# Patient Record
Sex: Female | Born: 1945 | Race: Black or African American | Hispanic: No | Smoking: Never smoker
Health system: Southern US, Community
[De-identification: ages and names within clinical notes are randomized; demographics above are authoritative.]

## PROBLEM LIST (undated history)

## (undated) DIAGNOSIS — M1990 Unspecified osteoarthritis, unspecified site: Secondary | ICD-10-CM

## (undated) DIAGNOSIS — E119 Type 2 diabetes mellitus without complications: Secondary | ICD-10-CM

## (undated) DIAGNOSIS — I10 Essential (primary) hypertension: Secondary | ICD-10-CM

## (undated) DIAGNOSIS — T7840XA Allergy, unspecified, initial encounter: Secondary | ICD-10-CM

## (undated) DIAGNOSIS — K219 Gastro-esophageal reflux disease without esophagitis: Secondary | ICD-10-CM

## (undated) DIAGNOSIS — R42 Dizziness and giddiness: Secondary | ICD-10-CM

## (undated) HISTORY — PX: ABDOMINAL HYSTERECTOMY: SHX81

## (undated) HISTORY — PX: COLONOSCOPY: SHX174

## (undated) HISTORY — PX: ROTATOR CUFF REPAIR: SHX139

## (undated) HISTORY — PX: CHOLECYSTECTOMY: SHX55

---

## 2010-06-15 ENCOUNTER — Emergency Department (HOSPITAL_BASED_OUTPATIENT_CLINIC_OR_DEPARTMENT_OTHER)
Admission: EM | Admit: 2010-06-15 | Discharge: 2010-06-15 | Disposition: A | Discharge status: Home / Self Care | Payer: No Typology Code available for payment source | Attending: Emergency Medicine | Admitting: Emergency Medicine

## 2010-06-15 DIAGNOSIS — Z043 Encounter for examination and observation following other accident: Secondary | ICD-10-CM | POA: Insufficient documentation

## 2014-10-05 ENCOUNTER — Other Ambulatory Visit: Payer: Self-pay | Admitting: Orthopaedic Surgery

## 2014-10-05 DIAGNOSIS — M25511 Pain in right shoulder: Secondary | ICD-10-CM

## 2014-10-06 ENCOUNTER — Ambulatory Visit
Admission: RE | Admit: 2014-10-06 | Discharge: 2014-10-06 | Disposition: A | Discharge status: Home / Self Care | Payer: Medicare Other | Source: Ambulatory Visit | Attending: Orthopaedic Surgery | Admitting: Orthopaedic Surgery

## 2014-10-06 DIAGNOSIS — M25511 Pain in right shoulder: Secondary | ICD-10-CM

## 2015-10-01 ENCOUNTER — Other Ambulatory Visit (HOSPITAL_BASED_OUTPATIENT_CLINIC_OR_DEPARTMENT_OTHER): Payer: Self-pay | Admitting: Orthopaedic Surgery

## 2015-10-01 DIAGNOSIS — M545 Low back pain: Secondary | ICD-10-CM

## 2015-10-01 DIAGNOSIS — M48061 Spinal stenosis, lumbar region without neurogenic claudication: Secondary | ICD-10-CM

## 2015-10-05 ENCOUNTER — Ambulatory Visit (HOSPITAL_BASED_OUTPATIENT_CLINIC_OR_DEPARTMENT_OTHER)
Admission: RE | Admit: 2015-10-05 | Discharge: 2015-10-05 | Disposition: A | Discharge status: Home / Self Care | Payer: Medicare Other | Source: Ambulatory Visit | Attending: Orthopaedic Surgery | Admitting: Orthopaedic Surgery

## 2015-10-05 DIAGNOSIS — M4807 Spinal stenosis, lumbosacral region: Secondary | ICD-10-CM | POA: Insufficient documentation

## 2015-10-05 DIAGNOSIS — M7138 Other bursal cyst, other site: Secondary | ICD-10-CM | POA: Insufficient documentation

## 2015-10-05 DIAGNOSIS — M545 Low back pain: Secondary | ICD-10-CM

## 2015-10-05 DIAGNOSIS — M4806 Spinal stenosis, lumbar region: Secondary | ICD-10-CM | POA: Diagnosis not present

## 2015-10-05 DIAGNOSIS — G541 Lumbosacral plexus disorders: Secondary | ICD-10-CM | POA: Insufficient documentation

## 2015-10-05 DIAGNOSIS — M48061 Spinal stenosis, lumbar region without neurogenic claudication: Secondary | ICD-10-CM

## 2015-10-05 DIAGNOSIS — M5136 Other intervertebral disc degeneration, lumbar region: Secondary | ICD-10-CM | POA: Diagnosis not present

## 2016-03-02 ENCOUNTER — Other Ambulatory Visit (INDEPENDENT_AMBULATORY_CARE_PROVIDER_SITE_OTHER): Payer: Self-pay

## 2016-03-02 MED ORDER — HYDROCODONE-ACETAMINOPHEN 5-325 MG PO TABS: ORAL_TABLET | ORAL | 0 refills | Status: DC

## 2016-03-19 ENCOUNTER — Encounter (INDEPENDENT_AMBULATORY_CARE_PROVIDER_SITE_OTHER): Payer: Self-pay | Admitting: Orthopaedic Surgery

## 2016-03-19 ENCOUNTER — Ambulatory Visit (INDEPENDENT_AMBULATORY_CARE_PROVIDER_SITE_OTHER): Payer: Medicare Other | Admitting: Orthopaedic Surgery

## 2016-03-19 ENCOUNTER — Ambulatory Visit (INDEPENDENT_AMBULATORY_CARE_PROVIDER_SITE_OTHER): Payer: Medicare Other

## 2016-03-19 DIAGNOSIS — M25512 Pain in left shoulder: Secondary | ICD-10-CM | POA: Diagnosis not present

## 2016-03-19 DIAGNOSIS — G8929 Other chronic pain: Secondary | ICD-10-CM

## 2016-03-19 MED ORDER — LIDOCAINE HCL 1 % IJ SOLN
3.0000 mL | INTRAMUSCULAR | Status: AC | PRN
  Administered 2016-03-19: 3 mL

## 2016-03-19 MED ORDER — BUPIVACAINE HCL 0.5 % IJ SOLN
3.0000 mL | INTRAMUSCULAR | Status: AC | PRN
  Administered 2016-03-19: 3 mL via INTRA_ARTICULAR

## 2016-03-19 MED ORDER — METHYLPREDNISOLONE ACETATE 40 MG/ML IJ SUSP
40.0000 mg | INTRAMUSCULAR | Status: AC | PRN
  Administered 2016-03-19: 40 mg via INTRA_ARTICULAR

## 2016-03-19 NOTE — Progress Notes (Signed)
   Office Visit Note   Patient: Tonya Chaney           Date of Birth: 07/10/1945           MRN: [id number] Visit Date: 03/19/2016              Requested by: Agustina CaroliKristin D Hicks, MD No address on file PCP: Agustina CaroliHICKS, KRISTIN D, MD   Assessment & Plan: Visit Diagnoses:  1. Chronic left shoulder pain     Plan: Impression is left rotator cuff syndrome with preservation of rotator cuff function. Subacromial injection provided today. Home exercises. Follow-up as needed.  Follow-Up Instructions: Return if symptoms worsen or fail to improve.   Orders:  Orders Placed This Encounter  Procedures  . Large Joint Injection/Arthrocentesis  . XR Shoulder Left   No orders of the defined types were placed in this encounter.     Procedures: Large Joint Inj Date/Time: 03/19/2016 8:04 PM Performed by: Tarry KosXU, NAIPING M Authorized by: Tarry KosXU, NAIPING M   Consent Given by:  Patient Timeout: prior to procedure the correct patient, procedure, and site was verified   Location:  Shoulder Site:  L subacromial bursa Prep: patient was prepped and draped in usual sterile fashion   Needle Size:  22 G Approach:  Posterior Ultrasound Guidance: No   Fluoroscopic Guidance: No   Arthrogram: No   Medications:  3 mL lidocaine 1 %; 3 mL bupivacaine 0.5 %; 40 mg methylPREDNISolone acetate 40 MG/ML     Clinical Data: No additional findings.   Subjective: Chief Complaint  Patient presents with  . Left Shoulder - Pain    Patient is a 71 year old female comes in with new problem left shoulder pain for about 6 months. Pain is worse with picking up things and sometimes will radiate up the neck and down the arm but not past the elbow. She has some difficulty sleeping. She's been taking Norco and Biofreeze.    Review of Systems Complete review of systems is negative except for history of present illness  Objective: Vital Signs: There were no vitals taken for this visit.  Physical Exam Well-developed  well-nourished no acute distress alert and oriented 3 Ortho Exam Exam of the left shoulder shows positive drop arm test. She endorses no nagging aching pain in the left shoulder. Positive empty can testing. Overall she has good function. Specialty Comments:  No specialty comments available.  Imaging: Xr Shoulder Left  Result Date: 03/19/2016 No acute findings    PMFS History: There are no active problems to display for this patient.  No past medical history on file.  No family history on file.  No past surgical history on file. Social History   Occupational History  . Not on file.   Social History Main Topics  . Smoking status: Never Smoker  . Smokeless tobacco: Never Used  . Alcohol use Not on file  . Drug use: Unknown  . Sexual activity: Not on file

## 2016-04-15 ENCOUNTER — Emergency Department (HOSPITAL_BASED_OUTPATIENT_CLINIC_OR_DEPARTMENT_OTHER)
Admission: EM | Admit: 2016-04-15 | Discharge: 2016-04-16 | Payer: Medicare Other | Attending: Emergency Medicine | Admitting: Emergency Medicine

## 2016-04-15 ENCOUNTER — Encounter (HOSPITAL_BASED_OUTPATIENT_CLINIC_OR_DEPARTMENT_OTHER): Payer: Self-pay | Admitting: *Deleted

## 2016-04-15 ENCOUNTER — Emergency Department (HOSPITAL_BASED_OUTPATIENT_CLINIC_OR_DEPARTMENT_OTHER): Payer: Medicare Other

## 2016-04-15 DIAGNOSIS — R079 Chest pain, unspecified: Secondary | ICD-10-CM | POA: Diagnosis not present

## 2016-04-15 DIAGNOSIS — Z794 Long term (current) use of insulin: Secondary | ICD-10-CM | POA: Insufficient documentation

## 2016-04-15 DIAGNOSIS — Z7982 Long term (current) use of aspirin: Secondary | ICD-10-CM | POA: Insufficient documentation

## 2016-04-15 DIAGNOSIS — Z79899 Other long term (current) drug therapy: Secondary | ICD-10-CM | POA: Diagnosis not present

## 2016-04-15 DIAGNOSIS — E119 Type 2 diabetes mellitus without complications: Secondary | ICD-10-CM | POA: Diagnosis not present

## 2016-04-15 DIAGNOSIS — I10 Essential (primary) hypertension: Secondary | ICD-10-CM | POA: Insufficient documentation

## 2016-04-15 DIAGNOSIS — R0600 Dyspnea, unspecified: Secondary | ICD-10-CM | POA: Diagnosis not present

## 2016-04-15 DIAGNOSIS — R0609 Other forms of dyspnea: Secondary | ICD-10-CM

## 2016-04-15 DIAGNOSIS — R609 Edema, unspecified: Secondary | ICD-10-CM

## 2016-04-15 HISTORY — DX: Essential (primary) hypertension: I10

## 2016-04-15 HISTORY — DX: Type 2 diabetes mellitus without complications: E11.9

## 2016-04-15 LAB — CBC WITH DIFFERENTIAL/PLATELET
Basophils Absolute: 0 10*3/uL (ref 0.0–0.1)
Basophils Relative: 0 %
Eosinophils Absolute: 0.2 10*3/uL (ref 0.0–0.7)
Eosinophils Relative: 2 %
HCT: 34.3 % — ABNORMAL LOW (ref 36.0–46.0)
Hemoglobin: 10.7 g/dL — ABNORMAL LOW (ref 12.0–15.0)
Lymphocytes Relative: 48 %
Lymphs Abs: 3.5 10*3/uL (ref 0.7–4.0)
MCH: 26.7 pg (ref 26.0–34.0)
MCHC: 31.2 g/dL (ref 30.0–36.0)
MCV: 85.5 fL (ref 78.0–100.0)
Monocytes Absolute: 0.5 10*3/uL (ref 0.1–1.0)
Monocytes Relative: 7 %
Neutro Abs: 3.2 10*3/uL (ref 1.7–7.7)
Neutrophils Relative %: 43 %
Platelets: 328 10*3/uL (ref 150–400)
RBC: 4.01 MIL/uL (ref 3.87–5.11)
RDW: 13.6 % (ref 11.5–15.5)
WBC: 7.4 10*3/uL (ref 4.0–10.5)

## 2016-04-15 LAB — BASIC METABOLIC PANEL
Anion gap: 6 (ref 5–15)
BUN: 8 mg/dL (ref 6–20)
CO2: 28 mmol/L (ref 22–32)
Calcium: 8.8 mg/dL — ABNORMAL LOW (ref 8.9–10.3)
Chloride: 101 mmol/L (ref 101–111)
Creatinine, Ser: 0.69 mg/dL (ref 0.44–1.00)
GFR calc Af Amer: >60 mL/min (ref >60)
GFR calc non Af Amer: >60 mL/min (ref >60)
Glucose, Bld: 208 mg/dL — ABNORMAL HIGH (ref 65–99)
Potassium: 3.5 mmol/L (ref 3.5–5.1)
Sodium: 135 mmol/L (ref 135–145)

## 2016-04-15 LAB — BRAIN NATRIURETIC PEPTIDE: B Natriuretic Peptide: 13.1 pg/mL (ref 0.0–100.0)

## 2016-04-15 LAB — TROPONIN I: Troponin I: <0.03 ng/mL (ref <0.03)

## 2016-04-15 MED ORDER — ETOMIDATE 2 MG/ML IV SOLN: 12.0000 mg | Freq: Once | INTRAVENOUS | Status: DC

## 2016-04-15 NOTE — ED Notes (Signed)
Pt returned from xray

## 2016-04-15 NOTE — ED Provider Notes (Signed)
[facility] Note   CSN: [id number] Arrival date & time: 04/15/16  1947  By signing my name below, I, Talbert Nan, attest that this documentation has been prepared under the direction and in the presence of Laurence Spates, MD. Electronically Signed: Talbert Nan, Scribe. 04/15/16. 9:02 PM.   History   Chief Complaint Chief Complaint  Patient presents with  . Chest Pain    HPI Tonya Chaney is a 71 y.o. female with h/o Dm, HTN who presents to the Emergency Department complaining of gradual onset, waxing and waning, intermittent right chest pain under right arm that has been going on randomly for 7 days. Pt has  bilateral leg swelling, progressive SOB that is worse when laying flat and exacerbated by exertion. Pt reports that she has noticed weight gain recently. Pt was on lasix previously but has been out of lasix for 2 months.   FH notable for brother and sister who each died of MI in their 78s.   The history is provided by the patient. No language interpreter was used.    Past Medical History:  Diagnosis Date  . Diabetes mellitus without complication (HCC)   . Hypertension     There are no active problems to display for this patient.   Past Surgical History:  Procedure Laterality Date  . ABDOMINAL HYSTERECTOMY    . CHOLECYSTECTOMY      OB History    No data available       Home Medications    Prior to Admission medications   Medication Sig Start Date End Date Taking? Authorizing Provider  aspirin 81 MG chewable tablet Chew by mouth daily.   Yes Historical Provider, MD  hydrochlorothiazide (HYDRODIURIL) 25 MG tablet Take 25 mg by mouth daily.   Yes Historical Provider, MD  insulin lispro (HUMALOG) 100 UNIT/ML injection Inject into the skin 3 (three) times daily before meals.   Yes Historical Provider, MD  losartan (COZAAR) 100 MG tablet Take 100 mg by mouth daily.   Yes Historical Provider, MD  metFORMIN (GLUCOPHAGE) 500 MG tablet Take  by mouth 2 (two) times daily with a meal.   Yes Historical Provider, MD  omeprazole (PRILOSEC) 40 MG capsule Take 40 mg by mouth daily.   Yes Historical Provider, MD  tiZANidine (ZANAFLEX) 2 MG tablet Take by mouth every 6 (six) hours as needed for muscle spasms.   Yes Historical Provider, MD  HYDROcodone-acetaminophen (NORCO/VICODIN) 5-325 MG tablet TAKE 1 TAB PO DAILY PRN PAIN 03/02/16   Tarry Kos, MD    Family History History reviewed. No pertinent family history.  Social History Social History  Substance Use Topics  . Smoking status: Never Smoker  . Smokeless tobacco: Never Used  . Alcohol use No     Allergies   Penicillins   Review of Systems Review of Systems  Cardiovascular: Positive for chest pain.   A complete 10 system review of systems was obtained and all systems are negative except as noted in the HPI and PMH.   Physical Exam Updated Vital Signs BP 125/61   Pulse 93   Temp 98.2 F (36.8 C) (Oral)   Resp 20   Ht 5\' 5"  (1.651 m)   Wt 253 lb (114.8 kg)   SpO2 100%   BMI 42.10 kg/m   Physical Exam  Constitutional: She is oriented to person, place, and time. She appears well-developed and well-nourished. No distress.  HENT:  Head: Normocephalic and atraumatic.  Moist mucous membranes  Eyes:  Conjunctivae are normal. Pupils are equal, round, and reactive to light.  Neck: Neck supple.  Cardiovascular: Normal rate, regular rhythm and normal heart sounds.   No murmur heard. Pulmonary/Chest: Effort normal and breath sounds normal.  Abdominal: Soft. Bowel sounds are normal. She exhibits no distension. There is no tenderness.  Musculoskeletal: She exhibits edema.  3+ pitting edema BLE.  Neurological: She is alert and oriented to person, place, and time.  Fluent speech  Skin: Skin is warm and dry.  Psychiatric: She has a normal mood and affect. Judgment normal.  Nursing note and vitals reviewed.    ED Treatments / Results   DIAGNOSTIC STUDIES: Oxygen  Saturation is 99% on room air, normal by my interpretation.    COORDINATION OF CARE: 9:02 PM Discussed treatment plan with pt at bedside and pt agreed to plan.  Labs (all labs ordered are listed, but only abnormal results are displayed) Labs Reviewed  CBC WITH DIFFERENTIAL/PLATELET - Abnormal; Notable for the following:       Result Value   Hemoglobin 10.7 (*)    HCT 34.3 (*)    All other components within normal limits  BASIC METABOLIC PANEL - Abnormal; Notable for the following:    Glucose, Bld 208 (*)    Calcium 8.8 (*)    All other components within normal limits  D-DIMER, QUANTITATIVE (NOT AT [facility]) - Abnormal; Notable for the following:    D-Dimer, Quant 0.77 (*)    All other components within normal limits  TROPONIN I  BRAIN NATRIURETIC PEPTIDE  TROPONIN I    EKG  EKG Interpretation  Date/Time:  Wednesday April 15 2016 20:12:51 EST Ventricular Rate:  81 PR Interval:  148 QRS Duration: 72 QT Interval:  382 QTC Calculation: 443 R Axis:   50 Text Interpretation:  Normal sinus rhythm Cannot rule out Anterior infarct , age undetermined Abnormal ECG No previous ECGs available Confirmed by Keyondra Lagrand MD, Shanaiya Bene [phone]) on 04/15/2016 8:24:12 PM       Radiology Dg Chest 2 View  Result Date: 04/15/2016 CLINICAL DATA:  71 y/o F; shortness of breath wall lying down. Chest pain. EXAM: CHEST  2 VIEW COMPARISON:  05/19/2007 chest radiograph. FINDINGS: Borderline cardiomegaly. Clear lungs. No pleural effusion. Moderate degenerative changes of thoracic spine. Right upper quadrant cholecystectomy clips. IMPRESSION: Borderline cardiomegaly.  Clear lungs. Electronically Signed   By: Mitzi Hansen M.D.   On: 04/15/2016 21:10    Procedures Procedures (including critical care time)  Medications Ordered in ED Medications - No data to display   Initial Impression / Assessment and Plan / ED Course  I have reviewed the triage vital signs and the nursing notes.  Pertinent  labs & imaging results that were available during my care of the patient were reviewed by me and considered in my medical decision making (see chart for details).     Pt w/ HTN and T2DM p/w worsening dyspnea particularly with exertion and laying flat associated with leg swelling. She also reports right chest pain that is not necessarily associated with these symptoms. She was comfortable on exam with reassuring vital signs, O2 saturation 100% on room air. She had pitting edema of lower extremities but no crackles or respiratory distress. EKG without acute ischemic changes. Obtained above lab work including troponin and BNP. Troponin negative and BNP was normal. Chest x-ray shows borderline cardiomegaly with no pulmonary edema or other acute findings. Lab work overall unremarkable. Because her chest x-ray and labs do not suggest obvious CHF as  an explanation of her symptoms, I added a d-dimer which was positive. I have ordered a CTA of chest to rule out PE. Given the progressive nature of her symptoms and her dyspnea on exertion, I'm concerned about the possibility of anginal symptoms. Given her comorbidities, age, and family history, her HEART score is 4-5 and I have recommended admission for cardiac work up. Signing out to the oncoming provider. Patient will be transferred for admission after completion of CT scan.  Final Clinical Impressions(s) / ED Diagnoses   Final diagnoses:  None    New Prescriptions New Prescriptions   No medications on file   I personally performed the services described in this documentation, which was scribed in my presence. The recorded information has been reviewed and is accurate.    Laurence Spatesachel Morgan Carisma Troupe, MD 04/16/16 [phone]

## 2016-04-15 NOTE — ED Notes (Signed)
Patient transported to X-ray 

## 2016-04-15 NOTE — ED Triage Notes (Addendum)
Pt c/o extr swelling, SOB while lying down  and chest pain x 1 week

## 2016-04-16 ENCOUNTER — Emergency Department (HOSPITAL_BASED_OUTPATIENT_CLINIC_OR_DEPARTMENT_OTHER): Payer: Medicare Other

## 2016-04-16 DIAGNOSIS — R079 Chest pain, unspecified: Secondary | ICD-10-CM | POA: Diagnosis present

## 2016-04-16 DIAGNOSIS — E119 Type 2 diabetes mellitus without complications: Secondary | ICD-10-CM | POA: Diagnosis not present

## 2016-04-16 DIAGNOSIS — Z79899 Other long term (current) drug therapy: Secondary | ICD-10-CM | POA: Diagnosis not present

## 2016-04-16 DIAGNOSIS — Z7982 Long term (current) use of aspirin: Secondary | ICD-10-CM | POA: Diagnosis not present

## 2016-04-16 DIAGNOSIS — R0600 Dyspnea, unspecified: Secondary | ICD-10-CM | POA: Diagnosis not present

## 2016-04-16 DIAGNOSIS — Z794 Long term (current) use of insulin: Secondary | ICD-10-CM | POA: Diagnosis not present

## 2016-04-16 DIAGNOSIS — R609 Edema, unspecified: Secondary | ICD-10-CM | POA: Diagnosis not present

## 2016-04-16 DIAGNOSIS — I10 Essential (primary) hypertension: Secondary | ICD-10-CM | POA: Diagnosis not present

## 2016-04-16 LAB — TROPONIN I: Troponin I: <0.03 ng/mL (ref <0.03)

## 2016-04-16 LAB — D-DIMER, QUANTITATIVE: D-Dimer, Quant: 0.77 ug/mL-FEU — ABNORMAL HIGH (ref 0.00–0.50)

## 2016-04-16 MED ORDER — IOPAMIDOL (ISOVUE-370) INJECTION 76%
100.0000 mL | Freq: Once | INTRAVENOUS | Status: AC | PRN
  Administered 2016-04-16: 100 mL via INTRAVENOUS

## 2016-04-16 NOTE — ED Notes (Signed)
Pt got up to run to the bathroom, was mildly SOB upon return to room, O2 saturations remained 100%

## 2016-07-27 ENCOUNTER — Telehealth (INDEPENDENT_AMBULATORY_CARE_PROVIDER_SITE_OTHER): Payer: Self-pay

## 2016-07-27 NOTE — Telephone Encounter (Signed)
Patient request out of work note to return on 07/29/16, due to severe shoulder pain.  Please Advise

## 2016-07-27 NOTE — Telephone Encounter (Signed)
Note made.  

## 2016-07-27 NOTE — Telephone Encounter (Signed)
See message.

## 2016-07-27 NOTE — Telephone Encounter (Signed)
yes

## 2016-10-13 ENCOUNTER — Other Ambulatory Visit (INDEPENDENT_AMBULATORY_CARE_PROVIDER_SITE_OTHER): Payer: Self-pay | Admitting: Orthopaedic Surgery

## 2016-10-13 ENCOUNTER — Ambulatory Visit (INDEPENDENT_AMBULATORY_CARE_PROVIDER_SITE_OTHER): Payer: Medicare Other | Admitting: Orthopaedic Surgery

## 2016-10-13 ENCOUNTER — Encounter (INDEPENDENT_AMBULATORY_CARE_PROVIDER_SITE_OTHER): Payer: Self-pay | Admitting: Orthopaedic Surgery

## 2016-10-13 ENCOUNTER — Ambulatory Visit (INDEPENDENT_AMBULATORY_CARE_PROVIDER_SITE_OTHER): Payer: Medicare Other

## 2016-10-13 DIAGNOSIS — G8929 Other chronic pain: Secondary | ICD-10-CM | POA: Diagnosis not present

## 2016-10-13 DIAGNOSIS — M545 Low back pain: Secondary | ICD-10-CM

## 2016-10-13 DIAGNOSIS — M25511 Pain in right shoulder: Secondary | ICD-10-CM

## 2016-10-13 MED ORDER — DICLOFENAC SODIUM 75 MG PO TBEC: 75.0000 mg | DELAYED_RELEASE_TABLET | Freq: Two times a day (BID) | ORAL | 2 refills | Status: DC

## 2016-10-13 MED ORDER — TIZANIDINE HCL 4 MG PO TABS: 4.0000 mg | ORAL_TABLET | Freq: Four times a day (QID) | ORAL | 0 refills | Status: DC | PRN

## 2016-10-13 NOTE — Progress Notes (Signed)
Office Visit Note   Patient: Tonya Chaney           Date of Birth: 03-07-1945           MRN: [id number] Visit Date: 10/13/2016              Requested by: No referring provider defined for this encounter. PCP: [facility]   Assessment & Plan: Visit Diagnoses:  1. Chronic bilateral low back pain, with sciatica presence unspecified   2. Chronic right shoulder pain     Plan: Overall impression is right upper arm muscular pain. Recommend tizanidine and meloxicam and rest. I don't think it's coming from her shoulder. In terms of her back referral back to [facility] for facet block at left L3-L4.  Follow-Up Instructions: Return if symptoms worsen or fail to improve.   Orders:  Orders Placed This Encounter  Procedures  . XR Lumbar Spine 2-3 Views  . Ambulatory referral to Physical Medicine Rehab   Meds ordered this encounter  Medications  . tiZANidine (ZANAFLEX) 4 MG tablet    Sig: Take 1 tablet (4 mg total) by mouth every 6 (six) hours as needed for muscle spasms.    Dispense:  30 tablet    Refill:  0  . diclofenac (VOLTAREN) 75 MG EC tablet    Sig: Take 1 tablet (75 mg total) by mouth 2 (two) times daily.    Dispense:  30 tablet    Refill:  2      Procedures: No procedures performed   Clinical Data: No additional findings.   Subjective: Chief Complaint  Patient presents with  . Lower Back - Pain  . Right Shoulder - Pain, Follow-up    Patient comes in today with right shoulder pain and lower back pain. Previous injection in her right shoulder really helped about 7 months ago. She is now mainly complaining of soreness in her upper arm region. She states it feels more of a muscular issue. Denies any neck pain or radicular symptoms. As her back she would like another injection with Dr. Alvester Morin which she previously had at the left L3-L4 facet joint.    Review of Systems  Constitutional: Negative.   HENT: Negative.   Eyes: Negative.   Respiratory:  Negative.   Cardiovascular: Negative.   Endocrine: Negative.   Musculoskeletal: Negative.   Neurological: Negative.   Hematological: Negative.   Psychiatric/Behavioral: Negative.   All other systems reviewed and are negative.    Objective: Vital Signs: There were no vitals taken for this visit.  Physical Exam  Constitutional: She is oriented to person, place, and time. She appears well-developed and well-nourished.  Pulmonary/Chest: Effort normal.  Neurological: She is alert and oriented to person, place, and time.  Skin: Skin is warm. Capillary refill takes less than 2 seconds.  Psychiatric: She has a normal mood and affect. Her behavior is normal. Judgment and thought content normal.  Nursing note and vitals reviewed.   Ortho Exam Right shoulder exam shows negative impingement and empty can test. Range of motion is normal. She is tender to palpation in the biceps region. Hand is warm well-perfused.  Low back exam is stable. No focal findings. Specialty Comments:  No specialty comments available.  Imaging: No results found.   PMFS History: There are no active problems to display for this patient.  Past Medical History:  Diagnosis Date  . Diabetes mellitus without complication (HCC)   . Hypertension     No family history on  file.  Past Surgical History:  Procedure Laterality Date  . ABDOMINAL HYSTERECTOMY    . CHOLECYSTECTOMY     Social History   Occupational History  . Not on file.   Social History Main Topics  . Smoking status: Never Smoker  . Smokeless tobacco: Never Used  . Alcohol use No  . Drug use: Unknown  . Sexual activity: Not on file

## 2016-10-13 NOTE — Telephone Encounter (Signed)
Please advise 

## 2016-10-15 ENCOUNTER — Ambulatory Visit (INDEPENDENT_AMBULATORY_CARE_PROVIDER_SITE_OTHER): Payer: Medicare Other | Admitting: Physical Medicine and Rehabilitation

## 2016-10-15 ENCOUNTER — Ambulatory Visit (INDEPENDENT_AMBULATORY_CARE_PROVIDER_SITE_OTHER): Payer: Medicare Other

## 2016-10-15 VITALS — BP 139/69 | HR 75 | Temp 98.0°F

## 2016-10-15 DIAGNOSIS — M545 Low back pain: Secondary | ICD-10-CM

## 2016-10-15 DIAGNOSIS — G8929 Other chronic pain: Secondary | ICD-10-CM

## 2016-10-15 DIAGNOSIS — M47816 Spondylosis without myelopathy or radiculopathy, lumbar region: Secondary | ICD-10-CM

## 2016-10-15 MED ORDER — METHYLPREDNISOLONE ACETATE 80 MG/ML IJ SUSP
80.0000 mg | Freq: Once | INTRAMUSCULAR | Status: AC
  Administered 2016-10-15: 80 mg

## 2016-10-15 MED ORDER — LIDOCAINE HCL (PF) 1 % IJ SOLN
2.0000 mL | Freq: Once | INTRAMUSCULAR | Status: AC
  Administered 2016-10-15: 2 mL

## 2016-10-15 NOTE — Patient Instructions (Signed)

## 2016-10-15 NOTE — Progress Notes (Deleted)
Increased pain across lower back for around 4 weeks. Right=left. Comes and goes. Seems to be worse with sitting. Denies leg pain.

## 2016-10-20 ENCOUNTER — Ambulatory Visit (INDEPENDENT_AMBULATORY_CARE_PROVIDER_SITE_OTHER): Payer: Medicare Other | Admitting: Orthopaedic Surgery

## 2016-10-20 NOTE — Procedures (Signed)
Tonya Chaney is a 71 year old female with several weeks of worsening severe low back pain with a chronic history of off and on low back pain. 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 She has bilateral low back pain without radicular pain. We will attempt facet joint cyst aspiration bilateral facet joint block.The injection  will be diagnostic and hopefully therapeutic. The patient has failed conservative care including time, medications and activity modification.  Lumbar Facet Joint Intra-Articular Injection(s) with Fluoroscopic Guidance  Patient: Tonya Chaney      Date of Birth: Apr 15, 1945 MRN: [id number] PCP: [facility]      Visit Date: 10/15/2016   Universal Protocol:    Date/Time: 10/15/2016  Consent Given By: the patient  Position: PRONE   Additional Comments: Vital signs were monitored before and after the procedure. Patient was prepped and draped in the usual sterile fashion. The correct patient, procedure, and site was verified.   Injection Procedure Details:  Procedure Site One Meds Administered:  Meds ordered this encounter  Medications  . lidocaine (PF) (XYLOCAINE) 1 % injection 2 mL  . methylPREDNISolone acetate (DEPO-MEDROL) injection 80 mg     Laterality: Bilateral  Location/Site:  L3-L4  Needle size: 22 guage  Needle type: Spinal  Needle Placement: Articular  Findings:  -Contrast Used: 1 mL iohexol 180 mg iodine/mL   -Comments: Excellent flow of contrast producing a partial arthrogram.  Procedure Details: The fluoroscope beam is vertically oriented in AP, and the inferior recess is visualized beneath the lower pole of the inferior apophyseal process, which represents the target point for needle insertion. When direct visualization is difficult the target point is located at the medial projection of the vertebral pedicle. The region overlying each aforementioned target is  locally anesthetized with a 1 to 2 ml. volume of 1% Lidocaine without Epinephrine.   The spinal needle was inserted into each of the above mentioned facet joints using biplanar fluoroscopic guidance. A 0.25 to 0.5 ml. volume of Isovue-250 was injected and a partial facet joint arthrogram was obtained. A single spot film was obtained of the resulting arthrogram.    One to 1.25 ml of the steroid/anesthetic solution was then injected into each of the facet joints noted above.   Additional Comments:  No complications occurred Dressing: Band-Aid    Post-procedure details: Patient was observed during the procedure. Post-procedure instructions were reviewed.  Patient left the clinic in stable condition.

## 2017-01-11 ENCOUNTER — Other Ambulatory Visit (INDEPENDENT_AMBULATORY_CARE_PROVIDER_SITE_OTHER): Payer: Self-pay | Admitting: Orthopaedic Surgery

## 2017-01-11 ENCOUNTER — Other Ambulatory Visit (INDEPENDENT_AMBULATORY_CARE_PROVIDER_SITE_OTHER): Payer: Self-pay

## 2017-01-11 DIAGNOSIS — M545 Low back pain: Principal | ICD-10-CM

## 2017-01-11 DIAGNOSIS — G8929 Other chronic pain: Secondary | ICD-10-CM

## 2017-01-11 MED ORDER — DICLOFENAC SODIUM 75 MG PO TBEC: DELAYED_RELEASE_TABLET | ORAL | 2 refills | Status: DC

## 2017-01-11 MED ORDER — HYDROCODONE-ACETAMINOPHEN 5-325 MG PO TABS: ORAL_TABLET | ORAL | 0 refills | Status: DC

## 2017-01-18 ENCOUNTER — Encounter (INDEPENDENT_AMBULATORY_CARE_PROVIDER_SITE_OTHER): Payer: Medicare Other | Admitting: Physical Medicine and Rehabilitation

## 2017-01-28 ENCOUNTER — Encounter (INDEPENDENT_AMBULATORY_CARE_PROVIDER_SITE_OTHER): Payer: Medicare Other | Admitting: Physical Medicine and Rehabilitation

## 2017-02-04 ENCOUNTER — Encounter (INDEPENDENT_AMBULATORY_CARE_PROVIDER_SITE_OTHER): Payer: Medicare Other | Admitting: Physical Medicine and Rehabilitation

## 2017-12-31 ENCOUNTER — Telehealth (INDEPENDENT_AMBULATORY_CARE_PROVIDER_SITE_OTHER): Payer: Self-pay | Admitting: Physical Medicine and Rehabilitation

## 2017-12-31 NOTE — Telephone Encounter (Signed)
Ok to repeat 

## 2018-01-03 NOTE — Telephone Encounter (Signed)
Scheduled for 01/14/18 at 1600 with driver.

## 2018-01-03 NOTE — Telephone Encounter (Signed)
Is auth needed? Scheduled for 11/22.

## 2018-01-05 NOTE — Telephone Encounter (Signed)
Notification or Prior Authorization is not required for the requested services  This [employer] members plan does not currently require a prior authorization for these services.  Decision ID #:[id number]

## 2018-01-14 ENCOUNTER — Ambulatory Visit (INDEPENDENT_AMBULATORY_CARE_PROVIDER_SITE_OTHER): Payer: Medicare Other | Admitting: Physical Medicine and Rehabilitation

## 2018-01-14 ENCOUNTER — Ambulatory Visit (INDEPENDENT_AMBULATORY_CARE_PROVIDER_SITE_OTHER): Payer: Self-pay

## 2018-01-14 ENCOUNTER — Encounter (INDEPENDENT_AMBULATORY_CARE_PROVIDER_SITE_OTHER): Payer: Self-pay | Admitting: Physical Medicine and Rehabilitation

## 2018-01-14 VITALS — BP 132/79 | HR 79 | Temp 97.5°F

## 2018-01-14 DIAGNOSIS — M47816 Spondylosis without myelopathy or radiculopathy, lumbar region: Secondary | ICD-10-CM

## 2018-01-14 MED ORDER — METHYLPREDNISOLONE ACETATE 80 MG/ML IJ SUSP
80.0000 mg | Freq: Once | INTRAMUSCULAR | Status: AC
  Administered 2018-01-14: 80 mg

## 2018-01-14 NOTE — Progress Notes (Signed)
Numeric Pain Rating Scale and Functional Assessment Average Pain 6   In the last MONTH (on 0-10 scale) has pain interfered with the following?  1. General activity like being  able to carry out your everyday physical activities such as walking, climbing stairs, carrying groceries, or moving a chair?  Rating(8)    +Driver, -BT, -Dye Allergies.  

## 2018-01-14 NOTE — Patient Instructions (Signed)

## 2018-02-28 NOTE — Progress Notes (Signed)
Tonya Chaney - 73 y.o. female MRN [id number]  Date of birth: 03-10-1945  Office Visit Note: Visit Date: 01/14/2018 PCP: [facility] Referred by: [facility]  Subjective: Chief Complaint  Patient presents with  . Lower Back - Pain  . Right Leg - Pain  . Left Leg - Pain   HPI: Tonya Chaney is a 73 y.o. female who comes in today For reevaluation and management of her low back pain and bilateral buttock and leg pain.  We saw her last year with completed intra-articular facet joint injection with aspiration with decent relief for several months.  She reports pain is 6 out of 10 worsening with walking and standing better at rest.  MRI findings from 2018 show facet arthropathy at L3-4 with multifactorial stenosis at that level centrally and lateral recess.  Facet joint cyst impacting the canal.  She is failed other care including medication management and therapy and does continue with home exercise and trying to activity modify when she can.  Symptoms seem to be referring into both legs more than 1 leg at this point.  She has had no paresthesias or focal weakness.  No new injuries.  ROS Otherwise per HPI.  Assessment & Plan: Visit Diagnoses:  1. Spondylosis without myelopathy or radiculopathy, lumbar region     Plan: Findings:  Chronic worsening severe at times low back pain referring into both buttocks and some in both legs with more of a neurogenic claudication.  This is still consistent with significant facet arthropathy at L3-4 with multifactorial severe stenosis on MRI from 2018.  Prior facet joint block and aspiration seem to give her quite a bit of relief for a while.  She has facet joint cyst that is impacting the canal.  She would be a good surgical candidate for decompression and we went over this again today.  She continues on current medication without any new injuries.  No focal weakness or red flag complaints.  I am going to repeat bilateral facet  joint blocks but would consider transforaminal injection.  As noted on the procedure itself the left-sided facet joint was hard to enter.  If she does not get much relief would consider repeating this injection.  Also consider her as a good surgical candidate for decompression.    Meds & Orders:  Meds ordered this encounter  Medications  . methylPREDNISolone acetate (DEPO-MEDROL) injection 80 mg    Orders Placed This Encounter  Procedures  . Facet Injection  . XR C-ARM NO REPORT    Follow-up: Return if symptoms worsen or fail to improve.   Procedures: No procedures performed  Lumbar Facet Joint Intra-Articular Injection(s) with Fluoroscopic Guidance  Patient: Tonya Chaney      Date of Birth: Jun 03, 1945 MRN: [id number] PCP: [facility]      Visit Date: 01/14/2018   Universal Protocol:    Date/Time: 01/14/2018  Consent Given By: the patient  Position: PRONE   Additional Comments: Vital signs were monitored before and after the procedure. Patient was prepped and draped in the usual sterile fashion. The correct patient, procedure, and site was verified.   Injection Procedure Details:  Procedure Site One Meds Administered:  Meds ordered this encounter  Medications  . methylPREDNISolone acetate (DEPO-MEDROL) injection 80 mg     Laterality: Bilateral  Location/Site:  L3-L4  Needle size: 22 guage  Needle type: Spinal  Needle Placement: Articular  Findings:  -Comments: Excellent flow of contrast producing a partial arthrogram.  Procedure  Details: The fluoroscope beam is vertically oriented in AP, and the inferior recess is visualized beneath the lower pole of the inferior apophyseal process, which represents the target point for needle insertion. When direct visualization is difficult the target point is located at the medial projection of the vertebral pedicle. The region overlying each aforementioned target is locally anesthetized with a 1 to 2  ml. volume of 1% Lidocaine without Epinephrine.   The spinal needle was inserted into each of the above mentioned facet joints using biplanar fluoroscopic guidance. A 0.25 to 0.5 ml. volume of Isovue-250 was injected and a partial facet joint arthrogram was obtained. A single spot film was obtained of the resulting arthrogram.    One to 1.25 ml of the steroid/anesthetic solution was then injected into each of the facet joints noted above.   Additional Comments:  The patient tolerated the procedure well Dressing: Band-Aid    Post-procedure details: Patient was observed during the procedure. Post-procedure instructions were reviewed.  Patient left the clinic in stable condition.    Clinical History: MRI LUMBAR SPINE WITHOUT CONTRAST 10/05/2015  TECHNIQUE: Multiplanar, multisequence MR imaging of the lumbar spine was performed. No intravenous contrast was administered.  COMPARISON:  None.  FINDINGS: Image quality degraded by motion.  Segmentation:  Normal segmentation.  Lowest disc space L5-S1  Alignment: Mild retrolisthesis L2-3. Mild anterior listhesis L3-4. Remaining alignment normal.  Vertebrae:  Negative for fracture or mass lesion.  Conus medullaris: Extends to the T12-L1 level and appears normal.  Paraspinal and other soft tissues: Paraspinous muscles appear normal and symmetric. No retroperitoneal mass or adenopathy. Abdominal aorta normal in caliber.  Disc levels:  L1-2:  Negative  L2-3: Mild retrolisthesis. Diffuse disc bulging and bilateral facet hypertrophy. Moderate spinal stenosis. Moderate left foraminal encroachment.  L3-4: Disc degeneration with diffuse disc bulging. Moderate to severe facet degeneration bilaterally. 10 mm synovial cyst on the left projecting into the canal and contributing to spinal stenosis and subarticular stenosis. There is severe spinal stenosis and compression of the left L4 nerve root in the subarticular zone.  Mild foraminal narrowing bilaterally  L4-5: Diffuse disc bulging and endplate spurring. Mild facet degeneration. Mild foraminal narrowing bilaterally  L5-S1: Disc degeneration and facet degeneration bilaterally. Mild foraminal narrowing bilaterally.  IMPRESSION: Moderate spinal stenosis at L2-3 with moderate left foraminal encroachment  Severe spinal stenosis L3-4 due to disc and facet degeneration. 10 mm synovial cyst on left contributes to spinal stenosis and compression of the left L4 nerve root in the subarticular zone  Mild foraminal narrowing bilaterally at L4-5 and L5-S1 due to disc and facet degeneration.   She reports that she has never smoked. She has never used smokeless tobacco. No results for input(s): HGBA1C, LABURIC in the last 8760 hours.  Objective:  VS:  HT:    WT:   BMI:     BP:132/79  HR:79bpm  TEMP:(!) 97.5 F (36.4 C)(Oral)  RESP:  Physical Exam Vitals signs and nursing note reviewed.  Constitutional:      General: She is not in acute distress.    Appearance: Normal appearance. She is well-developed. She is not ill-appearing.  HENT:     Head: Normocephalic and atraumatic.  Eyes:     Conjunctiva/sclera: Conjunctivae normal.     Pupils: Pupils are equal, round, and reactive to light.  Cardiovascular:     Rate and Rhythm: Normal rate.     Pulses: Normal pulses.  Pulmonary:     Effort: Pulmonary effort is normal.  Musculoskeletal:  Right lower leg: No edema.     Left lower leg: No edema.     Comments: Patient ambulates without aid but with a forward flexed lumbar spine.  She has concordant low back pain with extension and facet joint loading.  She has no pain over the greater trochanters and no pain with hip rotation she has good strength in the lower extremities bilaterally without any deficit symmetrically.  She has no clonus.  Skin:    General: Skin is warm and dry.     Findings: No erythema or rash.  Neurological:     General: No  focal deficit present.     Mental Status: She is alert and oriented to person, place, and time.     Sensory: No sensory deficit.     Motor: No abnormal muscle tone.     Coordination: Coordination normal.     Gait: Gait normal.  Psychiatric:        Mood and Affect: Mood normal.        Behavior: Behavior normal.     Ortho Exam Imaging: No results found.  Past Medical/Family/Surgical/Social History: Medications & Allergies reviewed per EMR, new medications updated. There are no active problems to display for this patient.  Past Medical History:  Diagnosis Date  . Diabetes mellitus without complication (HCC)   . Hypertension    History reviewed. No pertinent family history. Past Surgical History:  Procedure Laterality Date  . ABDOMINAL HYSTERECTOMY    . CHOLECYSTECTOMY     Social History   Occupational History  . Not on file  Tobacco Use  . Smoking status: Never Smoker  . Smokeless tobacco: Never Used  Substance and Sexual Activity  . Alcohol use: No  . Drug use: Not on file  . Sexual activity: Not on file

## 2018-02-28 NOTE — Procedures (Signed)
Lumbar Facet Joint Intra-Articular Injection(s) with Fluoroscopic Guidance  Patient: Tonya Chaney      Date of Birth: 12-23-1945 MRN: [id number] PCP: [facility]      Visit Date: 01/14/2018   Universal Protocol:    Date/Time: 01/14/2018  Consent Given By: the patient  Position: PRONE   Additional Comments: Vital signs were monitored before and after the procedure. Patient was prepped and draped in the usual sterile fashion. The correct patient, procedure, and site was verified.   Injection Procedure Details:  Procedure Site One Meds Administered:  Meds ordered this encounter  Medications  . methylPREDNISolone acetate (DEPO-MEDROL) injection 80 mg     Laterality: Bilateral  Location/Site:  L3-L4  Needle size: 22 guage  Needle type: Spinal  Needle Placement: Articular  Findings:  -Comments: Excellent flow of contrast producing a partial arthrogram.  Procedure Details: The fluoroscope beam is vertically oriented in AP, and the inferior recess is visualized beneath the lower pole of the inferior apophyseal process, which represents the target point for needle insertion. When direct visualization is difficult the target point is located at the medial projection of the vertebral pedicle. The region overlying each aforementioned target is locally anesthetized with a 1 to 2 ml. volume of 1% Lidocaine without Epinephrine.   The spinal needle was inserted into each of the above mentioned facet joints using biplanar fluoroscopic guidance. A 0.25 to 0.5 ml. volume of Isovue-250 was injected and a partial facet joint arthrogram was obtained. A single spot film was obtained of the resulting arthrogram.    One to 1.25 ml of the steroid/anesthetic solution was then injected into each of the facet joints noted above.   Additional Comments:  The patient tolerated the procedure well Dressing: Band-Aid    Post-procedure details: Patient was observed during the  procedure. Post-procedure instructions were reviewed.  Patient left the clinic in stable condition.

## 2018-09-23 ENCOUNTER — Ambulatory Visit: Payer: Self-pay

## 2018-09-23 ENCOUNTER — Other Ambulatory Visit: Payer: Self-pay

## 2018-09-23 ENCOUNTER — Encounter: Payer: Self-pay | Admitting: Orthopaedic Surgery

## 2018-09-23 ENCOUNTER — Ambulatory Visit (INDEPENDENT_AMBULATORY_CARE_PROVIDER_SITE_OTHER): Payer: Medicare Other | Admitting: Orthopaedic Surgery

## 2018-09-23 DIAGNOSIS — M25552 Pain in left hip: Secondary | ICD-10-CM | POA: Diagnosis not present

## 2018-09-23 NOTE — Progress Notes (Signed)
Subjective: Patient is here for ultrasound-guided intra-articular left hip injection.   Pain on anterolateral aspect for 2 months, no injury.  Objective:  Tender near greater trochanter.  Some pain with passive IR, but still pretty good ROM.  Procedure: Ultrasound-guided left hip injection: After sterile prep with Betadine, injected 8 cc 1% lidocaine without epinephrine and 40 mg methylprednisolone using a 22-gauge spinal needle, passing the needle through the iliofemoral ligament into the femoral head/neck junction.  Injectate seen filling joint capsule.  Not a lot of immediate relief.  Would consider GT injection if pain persists.

## 2018-09-23 NOTE — Progress Notes (Signed)
Office Visit Note   Patient: Tonya Chaney           Date of Birth: 1945/09/14           MRN: [id number] Visit Date: 09/23/2018              Requested by: [address] [place],  [place] [address] PCP: [place]: Visit Diagnoses:  1. Pain in left hip     Plan: Impression is left hip pain mainly due to hip osteoarthritis with some component of trochanteric bursitis and abductor tendinosis.  Unfortunately her diabetes is not really well controlled therefore we will just limit her to 1 injection today.  Dr. Junius Roads was able to perform ultrasound-guided hip injection today.  She tolerated this well.  Follow-up as needed.  Follow-Up Instructions: Return if symptoms worsen or fail to improve.   Orders:  Orders Placed This Encounter  Procedures  . XR HIP UNILAT W OR W/O PELVIS 2-3 VIEWS LEFT   No orders of the defined types were placed in this encounter.     Procedures: No procedures performed   Clinical Data: No additional findings.   Subjective: Chief Complaint  Patient presents with  . Left Hip - Pain    Tonya Chaney is a 73 year old female who is well-known to me who comes in for evaluation of left hip and groin pain.  She states that this has been bothering her for at least several months.  She denies any injuries.  She states that her main discomfort is groin pain that radiates down into the thigh.  It does not radiate past the knee.  Denies any back pain.  She also endorses some tenderness on the lateral aspect of the hip.  The pain is worse with walking and it is causing her to limp.  Denies any numbness or grinding or popping.   Review of Systems  Constitutional: Negative.   HENT: Negative.   Eyes: Negative.   Respiratory: Negative.   Cardiovascular: Negative.   Endocrine: Negative.   Musculoskeletal: Negative.   Neurological: Negative.   Hematological: Negative.    Psychiatric/Behavioral: Negative.   All other systems reviewed and are negative.    Objective: Vital Signs: There were no vitals taken for this visit.  Physical Exam Vitals signs and nursing note reviewed.  Constitutional:      Appearance: She is well-developed.  Pulmonary:     Effort: Pulmonary effort is normal.  Skin:    General: Skin is warm.     Capillary Refill: Capillary refill takes less than 2 seconds.  Neurological:     Mental Status: She is alert and oriented to person, place, and time.  Psychiatric:        Behavior: Behavior normal.        Thought Content: Thought content normal.        Judgment: Judgment normal.     Ortho Exam Left hip exam shows pain with internal rotation during logroll.  Positive Stinchfield sign.  Negative FADIR.  No sciatic tension signs.  Greater trochanter bursa is tender to palpation. Specialty Comments:  No specialty comments available.  Imaging: Xr Hip Unilat W Or W/o Pelvis 2-3 Views Left  Result Date: 09/23/2018 Mild osteoarthritis.  No significant DJD.    PMFS History: There are no active problems to display for this patient.  Past Medical History:  Diagnosis Date  . Diabetes mellitus without complication ([place])   .  Hypertension     History reviewed. No pertinent family history.  Past Surgical History:  Procedure Laterality Date  . ABDOMINAL HYSTERECTOMY    . CHOLECYSTECTOMY     Social History   Occupational History  . Not on file  Tobacco Use  . Smoking status: Never Smoker  . Smokeless tobacco: Never Used  Substance and Sexual Activity  . Alcohol use: No  . Drug use: Not on file  . Sexual activity: Not on file

## 2019-06-06 ENCOUNTER — Other Ambulatory Visit: Payer: Self-pay

## 2019-06-06 ENCOUNTER — Encounter: Payer: Self-pay | Admitting: Orthopaedic Surgery

## 2019-06-06 ENCOUNTER — Ambulatory Visit: Payer: Self-pay

## 2019-06-06 ENCOUNTER — Ambulatory Visit (INDEPENDENT_AMBULATORY_CARE_PROVIDER_SITE_OTHER): Payer: Medicare Other | Admitting: Orthopaedic Surgery

## 2019-06-06 DIAGNOSIS — M545 Low back pain, unspecified: Secondary | ICD-10-CM

## 2019-06-06 DIAGNOSIS — M25551 Pain in right hip: Secondary | ICD-10-CM

## 2019-06-06 MED ORDER — LIDOCAINE HCL 1 % IJ SOLN
3.0000 mL | INTRAMUSCULAR | Status: AC | PRN
  Administered 2019-06-06: 3 mL

## 2019-06-06 MED ORDER — BUPIVACAINE HCL 0.5 % IJ SOLN
3.0000 mL | INTRAMUSCULAR | Status: AC | PRN
  Administered 2019-06-06: 3 mL via INTRA_ARTICULAR

## 2019-06-06 MED ORDER — METHYLPREDNISOLONE ACETATE 40 MG/ML IJ SUSP
40.0000 mg | INTRAMUSCULAR | Status: AC | PRN
  Administered 2019-06-06: 40 mg via INTRA_ARTICULAR

## 2019-06-06 NOTE — Progress Notes (Signed)
   Office Visit Note   Patient: Tonya Chaney           Date of Birth: 1946-02-09           MRN: [id number] Visit Date: 06/06/2019              Requested by: [facility] [address] [place],  [place] [address] PCP: [facility]   Assessment & Plan: Visit Diagnoses:  1. Pain in right hip   2. Low back pain, unspecified back pain laterality, unspecified chronicity, unspecified whether sciatica present     Plan: Impression is lateral right hip pain.  Based on findings and our discussion of treatment options Tonya Chaney would like to try cortisone injection today which she tolerated well.  Home exercises and resistance bands were also provided.  She will also try Voltaren gel.  Follow-Up Instructions: Return if symptoms worsen or fail to improve.   Orders:  Orders Placed This Encounter  Procedures  . XR HIP UNILAT W OR W/O PELVIS 2-3 VIEWS RIGHT  . XR Lumbar Spine 2-3 Views   No orders of the defined types were placed in this encounter.     Procedures: Large Joint Inj: R greater trochanter on 06/06/2019 11:47 AM Indications: pain Details: 22 G needle  Arthrogram: No  Medications: 3 mL lidocaine 1 %; 3 mL bupivacaine 0.5 %; 40 mg methylPREDNISolone acetate 40 MG/ML Patient was prepped and draped in the usual sterile fashion.       Clinical Data: No additional findings.   Subjective: Chief Complaint  Patient presents with  . Right Hip - Pain  . Lower Back - Pain    Tonya Chaney returns today for her chronic right lateral hip pain that is worse with activity.  Occasionally she has some radiation into the groin pain she does have chronic low back pain.  The pain is worse with standing and walking.  Denies any injuries.   Review of Systems   Objective: Vital Signs: There were no vitals taken for this visit.  Physical Exam  Ortho Exam Right hip shows tenderness along the lateral aspect of the greater trochanter.  No pain  with hip range of motion in the groin. Specialty Comments:  No specialty comments available.  Imaging: XR HIP UNILAT W OR W/O PELVIS 2-3 VIEWS RIGHT  Result Date: 06/06/2019 Mild osteoarthritis of the right hip joint.  No acute abnormalities.  XR Lumbar Spine 2-3 Views  Result Date: 06/06/2019 Multilevel degenerative disc disease without any acute abnormalities    PMFS History: There are no problems to display for this patient.  Past Medical History:  Diagnosis Date  . Diabetes mellitus without complication (HCC)   . Hypertension     History reviewed. No pertinent family history.  Past Surgical History:  Procedure Laterality Date  . ABDOMINAL HYSTERECTOMY    . CHOLECYSTECTOMY     Social History   Occupational History  . Not on file  Tobacco Use  . Smoking status: Never Smoker  . Smokeless tobacco: Never Used  Substance and Sexual Activity  . Alcohol use: No  . Drug use: Not on file  . Sexual activity: Not on file

## 2019-08-03 ENCOUNTER — Telehealth: Payer: Self-pay

## 2019-08-03 MED ORDER — PREDNISONE 10 MG (21) PO TBPK: ORAL_TABLET | ORAL | 0 refills | Status: DC

## 2019-08-03 NOTE — Telephone Encounter (Signed)
Patient states she has Right hip numbness/ tingling all the way down. States when WB pain is there. Talking Tylenol and applying Biofreeze. Helps some.   Troch inj helped for 1 week. Have been doing home Excersises.    Pharm [facility] [place].    Would like to know what can be done or can something be called in ?

## 2019-08-03 NOTE — Addendum Note (Signed)
Addended by: Mayra Reel on: 08/03/2019 02:03 PM   Modules accepted: Orders

## 2019-08-03 NOTE — Telephone Encounter (Signed)
She is aware 

## 2019-08-03 NOTE — Telephone Encounter (Signed)
That sounds like it's coming from her back.  I'll send in dose pak

## 2019-11-14 ENCOUNTER — Encounter: Payer: Self-pay | Admitting: Orthopaedic Surgery

## 2019-11-14 ENCOUNTER — Ambulatory Visit: Payer: Self-pay

## 2019-11-14 ENCOUNTER — Ambulatory Visit (INDEPENDENT_AMBULATORY_CARE_PROVIDER_SITE_OTHER): Payer: Medicare Other | Admitting: Orthopaedic Surgery

## 2019-11-14 DIAGNOSIS — M79644 Pain in right finger(s): Secondary | ICD-10-CM

## 2019-11-14 MED ORDER — METHYLPREDNISOLONE 4 MG PO TBPK: ORAL_TABLET | ORAL | 0 refills | Status: DC

## 2019-11-14 MED ORDER — DICLOFENAC SODIUM 1 % EX GEL: 2.0000 g | Freq: Four times a day (QID) | CUTANEOUS | 5 refills | Status: DC

## 2019-11-14 NOTE — Progress Notes (Signed)
   Office Visit Note   Patient: Tonya Chaney           Date of Birth: 1945/08/08           MRN: [id number] Visit Date: 11/14/2019              Requested by: [facility] [address] [place],  [place] [address] PCP: [facility]   Assessment & Plan: Visit Diagnoses:  1. Pain in right finger(s)     Plan: Impression is end-stage right small finger PIP DJD.  Based on our discussion of treatment options she would like to try Voltaren gel as well as a Medrol Dosepak and Tylenol arthritis.  We briefly discussed surgery to fuse this joint but she is not interested in this at this time.  Follow-up as needed.  Follow-Up Instructions: Return if symptoms worsen or fail to improve.   Orders:  Orders Placed This Encounter  Procedures  . XR Finger Little Right   Meds ordered this encounter  Medications  . diclofenac Sodium (VOLTAREN) 1 % GEL    Sig: Apply 2 g topically 4 (four) times daily.    Dispense:  2 g    Refill:  5  . methylPREDNISolone (MEDROL DOSEPAK) 4 MG TBPK tablet    Sig: Use as directed    Dispense:  21 tablet    Refill:  0      Procedures: No procedures performed   Clinical Data: No additional findings.   Subjective: Chief Complaint  Patient presents with  . Right Little Finger - Pain, Edema    Tonya Chaney comes in today for evaluation of a painful right small finger.  She has had a chronically stiff small finger for years.  The pain is worse with pressure and there is some swelling.  She has been taking Tylenol arthritis.  Overall the pain has improved some.  Denies any numbness and tingling.   Review of Systems   Objective: Vital Signs: There were no vitals taken for this visit.  Physical Exam  Ortho Exam Right small finger shows a stiff PIP joint in flexion and around 75 degrees.  Slightly swollen and slightly tender to palpation.  Slight ulnar deviation.  Neurovascular intact. Specialty Comments:  No  specialty comments available.  Imaging: XR Finger Little Right  Result Date: 11/14/2019 Significant degenerative changes of the PIP joint in chronic flexion.    PMFS History: There are no problems to display for this patient.  Past Medical History:  Diagnosis Date  . Diabetes mellitus without complication (HCC)   . Hypertension     History reviewed. No pertinent family history.  Past Surgical History:  Procedure Laterality Date  . ABDOMINAL HYSTERECTOMY    . CHOLECYSTECTOMY     Social History   Occupational History  . Not on file  Tobacco Use  . Smoking status: Never Smoker  . Smokeless tobacco: Never Used  Substance and Sexual Activity  . Alcohol use: No  . Drug use: Not on file  . Sexual activity: Not on file

## 2019-12-22 ENCOUNTER — Other Ambulatory Visit: Payer: Self-pay

## 2019-12-22 ENCOUNTER — Encounter: Payer: Self-pay | Admitting: Orthopaedic Surgery

## 2019-12-22 ENCOUNTER — Ambulatory Visit (INDEPENDENT_AMBULATORY_CARE_PROVIDER_SITE_OTHER): Payer: Medicare Other | Admitting: Orthopaedic Surgery

## 2019-12-22 VITALS — Ht 65.0 in | Wt 234.0 lb

## 2019-12-22 DIAGNOSIS — M7061 Trochanteric bursitis, right hip: Secondary | ICD-10-CM

## 2019-12-22 MED ORDER — BUPIVACAINE HCL 0.25 % IJ SOLN
2.0000 mL | INTRAMUSCULAR | Status: AC | PRN
  Administered 2019-12-22: 2 mL via INTRA_ARTICULAR

## 2019-12-22 MED ORDER — LIDOCAINE HCL 1 % IJ SOLN
3.0000 mL | INTRAMUSCULAR | Status: AC | PRN
  Administered 2019-12-22: 3 mL

## 2019-12-22 MED ORDER — METHYLPREDNISOLONE ACETATE 40 MG/ML IJ SUSP
40.0000 mg | INTRAMUSCULAR | Status: AC | PRN
  Administered 2019-12-22: 40 mg via INTRA_ARTICULAR

## 2019-12-22 NOTE — Progress Notes (Signed)
   Office Visit Note   Patient: Tonya Chaney           Date of Birth: [birth year]           MRN: [id number] Visit Date: 12/22/2019              Requested by: [facility] [address] [place],  [place] [address] PCP: [facility]   Assessment & Plan: Visit Diagnoses:  1. Trochanteric bursitis, right hip     Plan: Impression is recurrent right hip trochanteric bursitis.  We reinjected this today with cortisone.  Have also provided with an iliotibial band stretching program.  She will follow up with [place] as needed.  Follow-Up Instructions: Return if symptoms worsen or fail to improve.   Orders:  Orders Placed This Encounter  Procedures  . Large Joint Inj: R greater trochanter   No orders of the defined types were placed in this encounter.     Procedures: Large Joint Inj: R greater trochanter on 12/22/2019 10:25 AM Indications: pain Details: 22 G needle, lateral approach Medications: 3 mL lidocaine 1 %; 2 mL bupivacaine 0.25 %; 40 mg methylPREDNISolone acetate 40 MG/ML      Clinical Data: No additional findings.   Subjective: Chief Complaint  Patient presents with  . Right Hip - Pain    HPI patient is a pleasant 74 year old female who comes in today with recurrent right lateral hip pain.  She was seen in our office for this almost 7 months ago.  Right hip greater trochanter was injected with cortisone which significantly helped until about 2 months ago.  Her pain has dramatically worsened over the past 2 weeks.  No new injury or change in activity.  The pain is all to the lateral hip.  She denies any pain to the groin.  She has increased pain when trying to lift her leg or sleeping on the right side.  She has been taking Tylenol arthritis as well as using Voltaren cream without relief of symptoms.  She does note occasional numbness to the lateral leg.  Review of Systems as detailed in HPI.  All others reviewed and are  negative.   Objective: Vital Signs: Ht 5\' 5"  (1.651 m)   Wt 234 lb (106.1 kg)   BMI 38.94 kg/m   Physical Exam well-developed well-nourished female no acute distress.  Alert and oriented x3.  Ortho Exam right lower extremity exam reveals marked tenderness to the greater trochanter.  Negative logroll negative FADIR.  Negative straight leg raise.  She is neurovascular intact distally.  Specialty Comments:  No specialty comments available.  Imaging: No new imaging   PMFS History: There are no problems to display for this patient.  Past Medical History:  Diagnosis Date  . Diabetes mellitus without complication (HCC)   . Hypertension     History reviewed. No pertinent family history.  Past Surgical History:  Procedure Laterality Date  . ABDOMINAL HYSTERECTOMY    . CHOLECYSTECTOMY     Social History   Occupational History  . Not on file  Tobacco Use  . Smoking status: Never Smoker  . Smokeless tobacco: Never Used  Substance and Sexual Activity  . Alcohol use: No  . Drug use: Not on file  . Sexual activity: Not on file

## 2020-03-19 ENCOUNTER — Telehealth: Payer: Self-pay

## 2020-03-19 ENCOUNTER — Other Ambulatory Visit: Payer: Self-pay

## 2020-03-19 DIAGNOSIS — M25551 Pain in right hip: Secondary | ICD-10-CM

## 2020-03-19 DIAGNOSIS — M545 Low back pain, unspecified: Secondary | ICD-10-CM

## 2020-03-19 MED ORDER — PREDNISONE 10 MG (21) PO TBPK
ORAL_TABLET | ORAL | 0 refills | Status: DC
Start: 2020-03-19 — End: 2020-04-30

## 2020-03-19 NOTE — Telephone Encounter (Signed)
I sent prednisone Dosepak

## 2020-03-19 NOTE — Telephone Encounter (Signed)
Anything that will help her in the meantime? She is unable to walk.

## 2020-03-19 NOTE — Telephone Encounter (Signed)
Yes let's do MRI right hip.  Thanks.

## 2020-03-19 NOTE — Telephone Encounter (Signed)
Patient is having Right hip pain. Last inj was  done 11/2019-which didn't help alot. States she cannot WB. Has not had an MRI done yet. Would like to know what she needs to do next. Meds/ MRI? Please advise.    Uses [facility] [address]  (Cassandra's mom)

## 2020-03-22 ENCOUNTER — Telehealth: Payer: Self-pay | Admitting: Orthopaedic Surgery

## 2020-03-22 NOTE — Telephone Encounter (Signed)
Called number twice and the phone number is not working.

## 2020-03-23 ENCOUNTER — Other Ambulatory Visit: Payer: Medicare Other

## 2020-03-27 ENCOUNTER — Ambulatory Visit
Admission: RE | Admit: 2020-03-27 | Discharge: 2020-03-27 | Disposition: A | Discharge status: Home / Self Care | Payer: Medicare Other | Source: Ambulatory Visit | Attending: Orthopaedic Surgery | Admitting: Orthopaedic Surgery

## 2020-03-27 DIAGNOSIS — M25551 Pain in right hip: Secondary | ICD-10-CM

## 2020-03-27 DIAGNOSIS — M545 Low back pain, unspecified: Secondary | ICD-10-CM

## 2020-03-28 ENCOUNTER — Telehealth: Payer: Self-pay

## 2020-03-28 ENCOUNTER — Other Ambulatory Visit: Payer: Self-pay | Admitting: Physician Assistant

## 2020-03-28 MED ORDER — METHOCARBAMOL 500 MG PO TABS: 500.0000 mg | ORAL_TABLET | Freq: Two times a day (BID) | ORAL | 0 refills | Status: DC | PRN

## 2020-03-28 NOTE — Telephone Encounter (Signed)
Got it.

## 2020-03-28 NOTE — Telephone Encounter (Signed)
[facility] calling to make sure we got the MRI of the Hip; they were calling with a call report on it

## 2020-03-31 ENCOUNTER — Other Ambulatory Visit: Payer: Medicare Other

## 2020-04-02 ENCOUNTER — Ambulatory Visit (INDEPENDENT_AMBULATORY_CARE_PROVIDER_SITE_OTHER): Payer: Medicare Other | Admitting: Orthopaedic Surgery

## 2020-04-02 ENCOUNTER — Encounter: Payer: Self-pay | Admitting: Orthopaedic Surgery

## 2020-04-02 ENCOUNTER — Other Ambulatory Visit: Payer: Self-pay

## 2020-04-02 DIAGNOSIS — M48062 Spinal stenosis, lumbar region with neurogenic claudication: Secondary | ICD-10-CM | POA: Diagnosis not present

## 2020-04-02 MED ORDER — OXYCODONE-ACETAMINOPHEN 5-325 MG PO TABS: 1.0000 | ORAL_TABLET | Freq: Every day | ORAL | 0 refills | Status: DC | PRN

## 2020-04-02 NOTE — Progress Notes (Signed)
Office Visit Note   Patient: Tonya Chaney           Date of Birth: [birth year]           MRN: [id number] Visit Date: 04/02/2020              Requested by: [facility] [address] [place],  [place] [address] PCP: [facility]   Assessment & Plan: Visit Diagnoses:  1. Neurogenic claudication due to lumbar spinal stenosis     Plan: Based on MRI findings she has severe spinal stenosis with widespread degenerative changes.  She has a partial proximal and hamstring tear which is asymptomatic.  Based on findings her symptoms are more consistent with spinal stenosis.  I have recommended referral to Dr. Marikay Alar of neurosurgery for surgical consultation.  Follow-Up Instructions: Return if symptoms worsen or fail to improve.   Orders:  Orders Placed This Encounter  Procedures  . Ambulatory referral to Neurosurgery   Meds ordered this encounter  Medications  . oxyCODONE-acetaminophen (PERCOCET) 5-325 MG tablet    Sig: Take 1-2 tablets by mouth daily as needed for severe pain.    Dispense:  30 tablet    Refill:  0      Procedures: No procedures performed   Clinical Data: No additional findings.   Subjective: Chief Complaint  Patient presents with  . Lower Back - Pain  . Right Hip - Pain    Tonya Chaney returns today for MRI review of the lumbar spine and the right hip.  She is unable to ambulate due to severe back and right hip pain.  We have given her 3 trochanteric injections without significant relief.  She is also had a few lumbar spine ESI's in the past without significant relief.  Denies any bowel or bladder dysfunction.   Review of Systems  Constitutional: Negative.   HENT: Negative.   Eyes: Negative.   Respiratory: Negative.   Cardiovascular: Negative.   Endocrine: Negative.   Musculoskeletal: Negative.   Neurological: Negative.   Hematological: Negative.   Psychiatric/Behavioral: Negative.   All other  systems reviewed and are negative.    Objective: Vital Signs: There were no vitals taken for this visit.  Physical Exam Vitals and nursing note reviewed.  Constitutional:      Appearance: She is well-developed and well-nourished.  Pulmonary:     Effort: Pulmonary effort is normal.  Skin:    General: Skin is warm.     Capillary Refill: Capillary refill takes less than 2 seconds.  Neurological:     Mental Status: She is alert and oriented to person, place, and time.  Psychiatric:        Mood and Affect: Mood and affect normal.        Behavior: Behavior normal.        Thought Content: Thought content normal.        Judgment: Judgment normal.     Ortho Exam She is tender to the greater trochanter.  She is attempted to walk but could not do so due to severe right hip and back pain.  She states that the pain is better when she leans forward. Specialty Comments:  No specialty comments available.  Imaging: No results found.   PMFS History: Patient Active Problem List   Diagnosis Date Noted  . 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 Diabetes mellitus without complication (HCC)   .  Hypertension     History reviewed. No pertinent family history.  Past Surgical History:  Procedure Laterality Date  . ABDOMINAL HYSTERECTOMY    . CHOLECYSTECTOMY     Social History   Occupational History  . Not on file  Tobacco Use  . Smoking status: Never Smoker  . Smokeless tobacco: Never Used  Substance and Sexual Activity  . Alcohol use: No  . Drug use: Not on file  . Sexual activity: Not on file

## 2020-04-04 ENCOUNTER — Ambulatory Visit: Payer: Medicare Other | Admitting: Orthopaedic Surgery

## 2020-04-07 ENCOUNTER — Other Ambulatory Visit: Payer: Medicare Other

## 2020-04-10 ENCOUNTER — Other Ambulatory Visit: Payer: Self-pay | Admitting: Neurological Surgery

## 2020-04-10 DIAGNOSIS — M48061 Spinal stenosis, lumbar region without neurogenic claudication: Secondary | ICD-10-CM

## 2020-04-27 ENCOUNTER — Other Ambulatory Visit (HOSPITAL_COMMUNITY)
Admission: RE | Admit: 2020-04-27 | Discharge: 2020-04-27 | Disposition: A | Discharge status: Home / Self Care | Payer: Medicare Other | Source: Ambulatory Visit | Attending: Neurological Surgery | Admitting: Neurological Surgery

## 2020-04-27 DIAGNOSIS — Z20822 Contact with and (suspected) exposure to covid-19: Secondary | ICD-10-CM | POA: Diagnosis not present

## 2020-04-27 DIAGNOSIS — Z01812 Encounter for preprocedural laboratory examination: Secondary | ICD-10-CM | POA: Diagnosis present

## 2020-04-27 LAB — SARS CORONAVIRUS 2 (TAT 6-24 HRS): SARS Coronavirus 2: NEGATIVE

## 2020-04-30 ENCOUNTER — Encounter (HOSPITAL_COMMUNITY): Payer: Self-pay | Admitting: Neurological Surgery

## 2020-04-30 NOTE — Anesthesia Preprocedure Evaluation (Addendum)
Anesthesia Evaluation  Patient identified by MRN, date of birth, ID band Patient awake    Reviewed: Allergy & Precautions, NPO status , Patient's Chart, lab work & pertinent test results  Airway Mallampati: II  TM Distance: >3 FB Neck ROM: Full    Dental no notable dental hx.    Pulmonary COPD,  COPD inhaler,    Pulmonary exam normal        Cardiovascular hypertension, Pt. on medications  Rhythm:Regular Rate:Normal     Neuro/Psych negative neurological ROS  negative psych ROS   GI/Hepatic Neg liver ROS, GERD  Medicated,  Endo/Other  diabetes, Well Controlled, Type 2, Oral Hypoglycemic Agents  Renal/GU negative Renal ROS  negative genitourinary   Musculoskeletal  (+) Arthritis , Osteoarthritis,  Lumbar stenosis   Abdominal (+)  Abdomen: soft. Bowel sounds: normal.  Peds  Hematology negative hematology ROS (+)   Anesthesia Other Findings   Reproductive/Obstetrics                            Anesthesia Physical Anesthesia Plan  ASA: II  Anesthesia Plan: General   Post-op Pain Management:    Induction: Intravenous  PONV Risk Score and Plan: 3 and Ondansetron, Dexamethasone and Treatment may vary due to age or medical condition  Airway Management Planned: Mask and Oral ETT  Additional Equipment: None  Intra-op Plan:   Post-operative Plan: Extubation in OR  Informed Consent: I have reviewed the patients History and Physical, chart, labs and discussed the procedure including the risks, benefits and alternatives for the proposed anesthesia with the patient or authorized representative who has indicated his/her understanding and acceptance.     Dental advisory given  Plan Discussed with: CRNA  Anesthesia Plan Comments: (Lab Results      Component                Value               Date                      WBC                      7.4                 05/01/2020                 HGB                      11.9 (L)            05/01/2020                HCT                      37.0                05/01/2020                MCV                      87.7                05/01/2020                PLT  280                 05/01/2020           Lab Results      Component                Value               Date                      NA                       136                 05/01/2020                K                        3.6                 05/01/2020                CO2                      26                  05/01/2020                GLUCOSE                  265 (H)             05/01/2020                BUN                      9                   05/01/2020                CREATININE               0.75                05/01/2020                CALCIUM                  9.0                 05/01/2020                GFRNONAA                 >60                 05/01/2020                GFRAA                    >60                 04/15/2016          )       Anesthesia Quick Evaluation

## 2020-05-01 ENCOUNTER — Ambulatory Visit (HOSPITAL_COMMUNITY): Payer: Medicare Other | Admitting: Anesthesiology

## 2020-05-01 ENCOUNTER — Other Ambulatory Visit: Payer: Self-pay

## 2020-05-01 ENCOUNTER — Ambulatory Visit (HOSPITAL_COMMUNITY): Payer: Medicare Other

## 2020-05-01 ENCOUNTER — Encounter (HOSPITAL_COMMUNITY): Payer: Self-pay | Admitting: Neurological Surgery

## 2020-05-01 ENCOUNTER — Ambulatory Visit (HOSPITAL_COMMUNITY)
Admission: RE | Admit: 2020-05-01 | Discharge: 2020-05-02 | Disposition: A | Discharge status: Home / Self Care | Payer: Medicare Other | Attending: Neurological Surgery | Admitting: Neurological Surgery

## 2020-05-01 ENCOUNTER — Encounter (HOSPITAL_COMMUNITY)
Admission: RE | Disposition: A | Discharge status: Home / Self Care | Payer: Self-pay | Source: Home / Self Care | Attending: Neurological Surgery

## 2020-05-01 DIAGNOSIS — Z419 Encounter for procedure for purposes other than remedying health state, unspecified: Secondary | ICD-10-CM

## 2020-05-01 DIAGNOSIS — Z7982 Long term (current) use of aspirin: Secondary | ICD-10-CM | POA: Insufficient documentation

## 2020-05-01 DIAGNOSIS — Z794 Long term (current) use of insulin: Secondary | ICD-10-CM | POA: Insufficient documentation

## 2020-05-01 DIAGNOSIS — M48061 Spinal stenosis, lumbar region without neurogenic claudication: Secondary | ICD-10-CM | POA: Diagnosis present

## 2020-05-01 DIAGNOSIS — Z79899 Other long term (current) drug therapy: Secondary | ICD-10-CM | POA: Insufficient documentation

## 2020-05-01 DIAGNOSIS — Z880 Allergy status to penicillin: Secondary | ICD-10-CM | POA: Insufficient documentation

## 2020-05-01 DIAGNOSIS — Z9889 Other specified postprocedural states: Secondary | ICD-10-CM

## 2020-05-01 DIAGNOSIS — Z7984 Long term (current) use of oral hypoglycemic drugs: Secondary | ICD-10-CM | POA: Diagnosis not present

## 2020-05-01 DIAGNOSIS — M5416 Radiculopathy, lumbar region: Secondary | ICD-10-CM | POA: Diagnosis not present

## 2020-05-01 DIAGNOSIS — Z7951 Long term (current) use of inhaled steroids: Secondary | ICD-10-CM | POA: Diagnosis not present

## 2020-05-01 HISTORY — DX: Allergy, unspecified, initial encounter: T78.40XA

## 2020-05-01 HISTORY — DX: Dizziness and giddiness: R42

## 2020-05-01 HISTORY — PX: LUMBAR LAMINECTOMY/DECOMPRESSION MICRODISCECTOMY: SHX5026

## 2020-05-01 HISTORY — DX: Unspecified osteoarthritis, unspecified site: M19.90

## 2020-05-01 HISTORY — DX: Gastro-esophageal reflux disease without esophagitis: K21.9

## 2020-05-01 LAB — CBC WITH DIFFERENTIAL/PLATELET
Abs Immature Granulocytes: 0.02 10*3/uL (ref 0.00–0.07)
Basophils Absolute: 0 10*3/uL (ref 0.0–0.1)
Basophils Relative: 1 %
Eosinophils Absolute: 0.6 10*3/uL — ABNORMAL HIGH (ref 0.0–0.5)
Eosinophils Relative: 8 %
HCT: 37 % (ref 36.0–46.0)
Hemoglobin: 11.9 g/dL — ABNORMAL LOW (ref 12.0–15.0)
Immature Granulocytes: 0 %
Lymphocytes Relative: 37 %
Lymphs Abs: 2.8 10*3/uL (ref 0.7–4.0)
MCH: 28.2 pg (ref 26.0–34.0)
MCHC: 32.2 g/dL (ref 30.0–36.0)
MCV: 87.7 fL (ref 80.0–100.0)
Monocytes Absolute: 0.5 10*3/uL (ref 0.1–1.0)
Monocytes Relative: 6 %
Neutro Abs: 3.5 10*3/uL (ref 1.7–7.7)
Neutrophils Relative %: 48 %
Platelets: 280 10*3/uL (ref 150–400)
RBC: 4.22 MIL/uL (ref 3.87–5.11)
RDW: 13.1 % (ref 11.5–15.5)
WBC: 7.4 10*3/uL (ref 4.0–10.5)
nRBC: 0 % (ref 0.0–0.2)

## 2020-05-01 LAB — BASIC METABOLIC PANEL
Anion gap: 9 (ref 5–15)
BUN: 9 mg/dL (ref 8–23)
CO2: 26 mmol/L (ref 22–32)
Calcium: 9 mg/dL (ref 8.9–10.3)
Chloride: 101 mmol/L (ref 98–111)
Creatinine, Ser: 0.75 mg/dL (ref 0.44–1.00)
GFR, Estimated: >60 mL/min (ref >60)
Glucose, Bld: 265 mg/dL — ABNORMAL HIGH (ref 70–99)
Potassium: 3.6 mmol/L (ref 3.5–5.1)
Sodium: 136 mmol/L (ref 135–145)

## 2020-05-01 LAB — PROTIME-INR
INR: 1 (ref 0.8–1.2)
Prothrombin Time: 13.1 seconds (ref 11.4–15.2)

## 2020-05-01 LAB — HEMOGLOBIN A1C
Hgb A1c MFr Bld: 11.7 % — ABNORMAL HIGH (ref 4.8–5.6)
Mean Plasma Glucose: 289.09 mg/dL

## 2020-05-01 LAB — GLUCOSE, CAPILLARY
Glucose-Capillary: 251 mg/dL — ABNORMAL HIGH (ref 70–99)
Glucose-Capillary: 245 mg/dL — ABNORMAL HIGH (ref 70–99)
Glucose-Capillary: 270 mg/dL — ABNORMAL HIGH (ref 70–99)
Glucose-Capillary: 259 mg/dL — ABNORMAL HIGH (ref 70–99)

## 2020-05-01 LAB — SURGICAL PCR SCREEN
MRSA, PCR: NEGATIVE
Staphylococcus aureus: NEGATIVE

## 2020-05-01 SURGERY — LUMBAR LAMINECTOMY/DECOMPRESSION MICRODISCECTOMY 2 LEVELS
Anesthesia: General | Site: Spine Lumbar | Laterality: Bilateral

## 2020-05-01 MED ORDER — POTASSIUM CHLORIDE IN NACL 20-0.9 MEQ/L-% IV SOLN: INTRAVENOUS | Status: DC

## 2020-05-01 MED ORDER — ACETAMINOPHEN 325 MG PO TABS
650.0000 mg | ORAL_TABLET | ORAL | Status: DC | PRN
  Administered 2020-05-01 – 2020-05-02 (×2): 650 mg via ORAL
  Filled 2020-05-01 (×2): qty 2

## 2020-05-01 MED ORDER — CHLORHEXIDINE GLUCONATE 0.12 % MT SOLN: 15.0000 mL | Freq: Once | OROMUCOSAL | Status: AC

## 2020-05-01 MED ORDER — FENTANYL CITRATE (PF) 100 MCG/2ML IJ SOLN: 25.0000 ug | INTRAMUSCULAR | Status: DC | PRN

## 2020-05-01 MED ORDER — BUPIVACAINE HCL (PF) 0.25 % IJ SOLN
INTRAMUSCULAR | Status: AC
  Filled 2020-05-01: qty 30

## 2020-05-01 MED ORDER — VANCOMYCIN HCL IN DEXTROSE 1-5 GM/200ML-% IV SOLN
INTRAVENOUS | Status: AC
  Filled 2020-05-01: qty 200

## 2020-05-01 MED ORDER — THROMBIN 5000 UNITS EX KIT
PACK | CUTANEOUS | Status: AC
  Filled 2020-05-01: qty 1

## 2020-05-01 MED ORDER — FENTANYL CITRATE (PF) 250 MCG/5ML IJ SOLN
INTRAMUSCULAR | Status: DC | PRN
  Administered 2020-05-01: 100 ug via INTRAVENOUS
  Administered 2020-05-01: 50 ug via INTRAVENOUS

## 2020-05-01 MED ORDER — MIDAZOLAM HCL 2 MG/2ML IJ SOLN
INTRAMUSCULAR | Status: AC
  Filled 2020-05-01: qty 2

## 2020-05-01 MED ORDER — ACETAMINOPHEN 650 MG RE SUPP: 650.0000 mg | RECTAL | Status: DC | PRN

## 2020-05-01 MED ORDER — THROMBIN 5000 UNITS EX SOLR
OROMUCOSAL | Status: DC | PRN
  Administered 2020-05-01: 5 mL via TOPICAL

## 2020-05-01 MED ORDER — FENTANYL CITRATE (PF) 250 MCG/5ML IJ SOLN
INTRAMUSCULAR | Status: AC
  Filled 2020-05-01: qty 5

## 2020-05-01 MED ORDER — PROPOFOL 10 MG/ML IV BOLUS
INTRAVENOUS | Status: DC | PRN
  Administered 2020-05-01: 150 mg via INTRAVENOUS

## 2020-05-01 MED ORDER — LIDOCAINE 2% (20 MG/ML) 5 ML SYRINGE
INTRAMUSCULAR | Status: DC | PRN
  Administered 2020-05-01: 60 mg via INTRAVENOUS

## 2020-05-01 MED ORDER — VANCOMYCIN HCL IN DEXTROSE 1-5 GM/200ML-% IV SOLN
1000.0000 mg | INTRAVENOUS | Status: AC
  Administered 2020-05-01: 1000 mg via INTRAVENOUS

## 2020-05-01 MED ORDER — DEXAMETHASONE SODIUM PHOSPHATE 10 MG/ML IJ SOLN: 10.0000 mg | Freq: Once | INTRAMUSCULAR | Status: DC

## 2020-05-01 MED ORDER — HEMOSTATIC AGENTS (NO CHARGE) OPTIME
TOPICAL | Status: DC | PRN
  Administered 2020-05-01: 1 via TOPICAL

## 2020-05-01 MED ORDER — AZELASTINE HCL 0.1 % NA SOLN
2.0000 | Freq: Every day | NASAL | Status: DC
  Administered 2020-05-02: 2 via NASAL
  Filled 2020-05-01: qty 30

## 2020-05-01 MED ORDER — LOSARTAN POTASSIUM 50 MG PO TABS
75.0000 mg | ORAL_TABLET | Freq: Every day | ORAL | Status: DC
  Administered 2020-05-02: 75 mg via ORAL
  Filled 2020-05-01: qty 2

## 2020-05-01 MED ORDER — METHOCARBAMOL 500 MG PO TABS
500.0000 mg | ORAL_TABLET | Freq: Four times a day (QID) | ORAL | Status: DC | PRN
  Administered 2020-05-01 – 2020-05-02 (×3): 500 mg via ORAL
  Filled 2020-05-01 (×3): qty 1

## 2020-05-01 MED ORDER — CHLORHEXIDINE GLUCONATE CLOTH 2 % EX PADS: 6.0000 | MEDICATED_PAD | Freq: Once | CUTANEOUS | Status: DC

## 2020-05-01 MED ORDER — GABAPENTIN 300 MG PO CAPS: 300.0000 mg | ORAL_CAPSULE | ORAL | Status: AC

## 2020-05-01 MED ORDER — CELECOXIB 200 MG PO CAPS
200.0000 mg | ORAL_CAPSULE | Freq: Two times a day (BID) | ORAL | Status: DC
  Administered 2020-05-01 – 2020-05-02 (×3): 200 mg via ORAL
  Filled 2020-05-01 (×3): qty 1

## 2020-05-01 MED ORDER — ACETAMINOPHEN 500 MG PO TABS
ORAL_TABLET | ORAL | Status: AC
  Administered 2020-05-01: 1000 mg via ORAL
  Filled 2020-05-01: qty 2

## 2020-05-01 MED ORDER — HYDROCODONE-ACETAMINOPHEN 7.5-325 MG PO TABS
1.0000 | ORAL_TABLET | Freq: Four times a day (QID) | ORAL | Status: DC
  Administered 2020-05-01 – 2020-05-02 (×4): 1 via ORAL
  Filled 2020-05-01 (×4): qty 1

## 2020-05-01 MED ORDER — THROMBIN 5000 UNITS EX KIT
PACK | CUTANEOUS | Status: AC
  Filled 2020-05-01: qty 2

## 2020-05-01 MED ORDER — SODIUM CHLORIDE 0.9% FLUSH: 3.0000 mL | INTRAVENOUS | Status: DC | PRN

## 2020-05-01 MED ORDER — PHENOL 1.4 % MT LIQD: 1.0000 | OROMUCOSAL | Status: DC | PRN

## 2020-05-01 MED ORDER — INSULIN ASPART 100 UNIT/ML ~~LOC~~ SOLN
SUBCUTANEOUS | Status: AC
  Filled 2020-05-01: qty 1

## 2020-05-01 MED ORDER — ONDANSETRON HCL 4 MG PO TABS: 4.0000 mg | ORAL_TABLET | Freq: Four times a day (QID) | ORAL | Status: DC | PRN

## 2020-05-01 MED ORDER — PHENYLEPHRINE HCL (PRESSORS) 10 MG/ML IV SOLN
INTRAVENOUS | Status: DC | PRN
  Administered 2020-05-01 (×2): 80 ug via INTRAVENOUS

## 2020-05-01 MED ORDER — FERROUS SULFATE 325 (65 FE) MG PO TABS
325.0000 mg | ORAL_TABLET | Freq: Every day | ORAL | Status: DC
Start: 2020-05-01 — End: 2020-05-02
  Administered 2020-05-01: 325 mg via ORAL
  Filled 2020-05-01: qty 1

## 2020-05-01 MED ORDER — ONDANSETRON HCL 4 MG/2ML IJ SOLN
INTRAMUSCULAR | Status: AC
  Filled 2020-05-01: qty 2

## 2020-05-01 MED ORDER — THROMBIN 5000 UNITS EX SOLR
CUTANEOUS | Status: DC | PRN
  Administered 2020-05-01 (×2): 5000 [IU] via TOPICAL

## 2020-05-01 MED ORDER — GABAPENTIN 300 MG PO CAPS
ORAL_CAPSULE | ORAL | Status: AC
  Administered 2020-05-01: 300 mg via ORAL
  Filled 2020-05-01: qty 1

## 2020-05-01 MED ORDER — CHLORHEXIDINE GLUCONATE 0.12 % MT SOLN
OROMUCOSAL | Status: AC
  Administered 2020-05-01: 15 mL via OROMUCOSAL
  Filled 2020-05-01: qty 15

## 2020-05-01 MED ORDER — ONDANSETRON HCL 4 MG/2ML IJ SOLN
INTRAMUSCULAR | Status: DC | PRN
  Administered 2020-05-01: 4 mg via INTRAVENOUS

## 2020-05-01 MED ORDER — MOMETASONE FURO-FORMOTEROL FUM 200-5 MCG/ACT IN AERO
2.0000 | INHALATION_SPRAY | Freq: Two times a day (BID) | RESPIRATORY_TRACT | Status: DC
  Administered 2020-05-01 – 2020-05-02 (×2): 2 via RESPIRATORY_TRACT
  Filled 2020-05-01: qty 8.8

## 2020-05-01 MED ORDER — MENTHOL 3 MG MT LOZG: 1.0000 | LOZENGE | OROMUCOSAL | Status: DC | PRN

## 2020-05-01 MED ORDER — ROCURONIUM BROMIDE 10 MG/ML (PF) SYRINGE
PREFILLED_SYRINGE | INTRAVENOUS | Status: DC | PRN
  Administered 2020-05-01: 60 mg via INTRAVENOUS

## 2020-05-01 MED ORDER — SODIUM CHLORIDE 0.9 % IV SOLN
250.0000 mL | INTRAVENOUS | Status: DC
  Administered 2020-05-01: 250 mL via INTRAVENOUS

## 2020-05-01 MED ORDER — PHENYLEPHRINE 40 MCG/ML (10ML) SYRINGE FOR IV PUSH (FOR BLOOD PRESSURE SUPPORT)
PREFILLED_SYRINGE | INTRAVENOUS | Status: AC
  Filled 2020-05-01: qty 10

## 2020-05-01 MED ORDER — MORPHINE SULFATE (PF) 2 MG/ML IV SOLN
2.0000 mg | INTRAVENOUS | Status: DC | PRN
  Administered 2020-05-01: 2 mg via INTRAVENOUS
  Filled 2020-05-01: qty 1

## 2020-05-01 MED ORDER — LIDOCAINE 2% (20 MG/ML) 5 ML SYRINGE
INTRAMUSCULAR | Status: AC
  Filled 2020-05-01: qty 5

## 2020-05-01 MED ORDER — SODIUM CHLORIDE 0.9% FLUSH
3.0000 mL | Freq: Two times a day (BID) | INTRAVENOUS | Status: DC
  Administered 2020-05-01: 3 mL via INTRAVENOUS

## 2020-05-01 MED ORDER — ACETAMINOPHEN 500 MG PO TABS: 1000.0000 mg | ORAL_TABLET | ORAL | Status: AC

## 2020-05-01 MED ORDER — ONDANSETRON HCL 4 MG/2ML IJ SOLN: 4.0000 mg | Freq: Once | INTRAMUSCULAR | Status: DC | PRN

## 2020-05-01 MED ORDER — MONTELUKAST SODIUM 10 MG PO TABS
10.0000 mg | ORAL_TABLET | Freq: Every day | ORAL | Status: DC
  Administered 2020-05-01: 10 mg via ORAL
  Filled 2020-05-01: qty 1

## 2020-05-01 MED ORDER — LACTATED RINGERS IV SOLN: INTRAVENOUS | Status: DC | PRN

## 2020-05-01 MED ORDER — LACTATED RINGERS IV SOLN: INTRAVENOUS | Status: DC

## 2020-05-01 MED ORDER — SENNA 8.6 MG PO TABS
1.0000 | ORAL_TABLET | Freq: Two times a day (BID) | ORAL | Status: DC
  Administered 2020-05-01 – 2020-05-02 (×3): 8.6 mg via ORAL
  Filled 2020-05-01 (×3): qty 1

## 2020-05-01 MED ORDER — INSULIN ASPART 100 UNIT/ML ~~LOC~~ SOLN
10.0000 [IU] | Freq: Once | SUBCUTANEOUS | Status: AC
  Administered 2020-05-01: 10 [IU] via SUBCUTANEOUS

## 2020-05-01 MED ORDER — METFORMIN HCL ER 750 MG PO TB24
750.0000 mg | ORAL_TABLET | Freq: Two times a day (BID) | ORAL | Status: DC
  Administered 2020-05-01 – 2020-05-02 (×2): 750 mg via ORAL
  Filled 2020-05-01 (×3): qty 1

## 2020-05-01 MED ORDER — BUPIVACAINE HCL (PF) 0.25 % IJ SOLN
INTRAMUSCULAR | Status: DC | PRN
  Administered 2020-05-01: 8 mL

## 2020-05-01 MED ORDER — ORAL CARE MOUTH RINSE: 15.0000 mL | Freq: Once | OROMUCOSAL | Status: AC

## 2020-05-01 MED ORDER — VANCOMYCIN HCL 1000 MG/200ML IV SOLN
1000.0000 mg | Freq: Once | INTRAVENOUS | Status: AC
  Administered 2020-05-01: 1000 mg via INTRAVENOUS
  Filled 2020-05-01: qty 200

## 2020-05-01 MED ORDER — ALBUTEROL SULFATE HFA 108 (90 BASE) MCG/ACT IN AERS
2.0000 | INHALATION_SPRAY | Freq: Four times a day (QID) | RESPIRATORY_TRACT | Status: DC | PRN
  Filled 2020-05-01: qty 6.7

## 2020-05-01 MED ORDER — PROPOFOL 10 MG/ML IV BOLUS
INTRAVENOUS | Status: AC
  Filled 2020-05-01: qty 40

## 2020-05-01 MED ORDER — INSULIN ASPART 100 UNIT/ML ~~LOC~~ SOLN
0.0000 [IU] | Freq: Three times a day (TID) | SUBCUTANEOUS | Status: DC
  Administered 2020-05-01: 5 [IU] via SUBCUTANEOUS
  Administered 2020-05-01: 8 [IU] via SUBCUTANEOUS
  Administered 2020-05-02: 11 [IU] via SUBCUTANEOUS
  Filled 2020-05-01: qty 0.15

## 2020-05-01 MED ORDER — SUGAMMADEX SODIUM 200 MG/2ML IV SOLN
INTRAVENOUS | Status: DC | PRN
  Administered 2020-05-01: 200 mg via INTRAVENOUS

## 2020-05-01 MED ORDER — METHOCARBAMOL 1000 MG/10ML IJ SOLN
500.0000 mg | Freq: Four times a day (QID) | INTRAVENOUS | Status: DC | PRN
  Filled 2020-05-01 (×2): qty 5

## 2020-05-01 MED ORDER — ONDANSETRON HCL 4 MG/2ML IJ SOLN: 4.0000 mg | Freq: Four times a day (QID) | INTRAMUSCULAR | Status: DC | PRN

## 2020-05-01 MED ORDER — 0.9 % SODIUM CHLORIDE (POUR BTL) OPTIME
TOPICAL | Status: DC | PRN
  Administered 2020-05-01: 1000 mL

## 2020-05-01 MED ORDER — PHENYLEPHRINE HCL-NACL 10-0.9 MG/250ML-% IV SOLN
INTRAVENOUS | Status: DC | PRN
  Administered 2020-05-01: 25 ug/min via INTRAVENOUS

## 2020-05-01 MED ORDER — ROCURONIUM BROMIDE 10 MG/ML (PF) SYRINGE
PREFILLED_SYRINGE | INTRAVENOUS | Status: AC
  Filled 2020-05-01: qty 10

## 2020-05-01 SURGICAL SUPPLY — 44 items
BAND RUBBER #18 3X1/16 STRL (MISCELLANEOUS) IMPLANT
BENZOIN TINCTURE PRP APPL 2/3 (GAUZE/BANDAGES/DRESSINGS) ×2 IMPLANT
BUR CARBIDE MATCH 3.0 (BURR) ×2 IMPLANT
CANISTER SUCT 3000ML PPV (MISCELLANEOUS) ×2 IMPLANT
COVER WAND RF STERILE (DRAPES) IMPLANT
DIFFUSER DRILL AIR PNEUMATIC (MISCELLANEOUS) ×2 IMPLANT
DRAPE LAPAROTOMY 100X72X124 (DRAPES) ×2 IMPLANT
DRAPE MICROSCOPE LEICA (MISCELLANEOUS) IMPLANT
DRAPE SURG 17X23 STRL (DRAPES) ×2 IMPLANT
DRSG OPSITE POSTOP 4X6 (GAUZE/BANDAGES/DRESSINGS) ×2 IMPLANT
DURAPREP 26ML APPLICATOR (WOUND CARE) ×2 IMPLANT
ELECT BLADE 4.0 EZ CLEAN MEGAD (MISCELLANEOUS) ×2
ELECT REM PT RETURN 9FT ADLT (ELECTROSURGICAL) ×2
ELECTRODE BLDE 4.0 EZ CLN MEGD (MISCELLANEOUS) ×1 IMPLANT
ELECTRODE REM PT RTRN 9FT ADLT (ELECTROSURGICAL) ×1 IMPLANT
GAUZE 4X4 16PLY RFD (DISPOSABLE) IMPLANT
GLOVE BIO SURGEON STRL SZ7 (GLOVE) ×2 IMPLANT
GLOVE BIO SURGEON STRL SZ8 (GLOVE) ×2 IMPLANT
GLOVE SURG POLYISO LF SZ7 (GLOVE) ×8 IMPLANT
GLOVE SURG SS PI 7.5 STRL IVOR (GLOVE) ×2 IMPLANT
GLOVE SURG UNDER POLY LF SZ7 (GLOVE) ×2 IMPLANT
GOWN STRL REUS W/ TWL LRG LVL3 (GOWN DISPOSABLE) ×1 IMPLANT
GOWN STRL REUS W/ TWL XL LVL3 (GOWN DISPOSABLE) ×2 IMPLANT
GOWN STRL REUS W/TWL 2XL LVL3 (GOWN DISPOSABLE) IMPLANT
GOWN STRL REUS W/TWL LRG LVL3 (GOWN DISPOSABLE) ×1
GOWN STRL REUS W/TWL XL LVL3 (GOWN DISPOSABLE) ×2
HEMOSTAT POWDER KIT SURGIFOAM (HEMOSTASIS) ×2 IMPLANT
KIT BASIN OR (CUSTOM PROCEDURE TRAY) ×2 IMPLANT
KIT TURNOVER KIT B (KITS) ×2 IMPLANT
NEEDLE HYPO 25X1 1.5 SAFETY (NEEDLE) ×2 IMPLANT
NEEDLE SPNL 20GX3.5 QUINCKE YW (NEEDLE) ×2 IMPLANT
NS IRRIG 1000ML POUR BTL (IV SOLUTION) ×2 IMPLANT
PACK LAMINECTOMY NEURO (CUSTOM PROCEDURE TRAY) ×2 IMPLANT
PAD ARMBOARD 7.5X6 YLW CONV (MISCELLANEOUS) ×10 IMPLANT
SPONGE SURGIFOAM ABS GEL SZ50 (HEMOSTASIS) ×2 IMPLANT
STRIP CLOSURE SKIN 1/2X4 (GAUZE/BANDAGES/DRESSINGS) ×2 IMPLANT
SUT VIC AB 0 CT1 18XCR BRD8 (SUTURE) ×1 IMPLANT
SUT VIC AB 0 CT1 8-18 (SUTURE) ×1
SUT VIC AB 2-0 CP2 18 (SUTURE) ×2 IMPLANT
SUT VIC AB 3-0 SH 8-18 (SUTURE) ×4 IMPLANT
SYR CONTROL 10ML LL (SYRINGE) ×2 IMPLANT
TOWEL GREEN STERILE (TOWEL DISPOSABLE) ×2 IMPLANT
TOWEL GREEN STERILE FF (TOWEL DISPOSABLE) ×2 IMPLANT
WATER STERILE IRR 1000ML POUR (IV SOLUTION) ×2 IMPLANT

## 2020-05-01 NOTE — H&P (Signed)
Subjective: Patient is a 75 y.o. female admitted for radiculopathy. Onset of symptoms was several months ago, gradually worsening since that time.  The pain is rated severe, and is located at the across the lower back and radiates to Rhip and leg. The pain is described as aching and occurs all day. The symptoms have been progressive. Symptoms are exacerbated by exercise. MRI or CT showed stenosis   Past Medical History:  Diagnosis Date  . Allergies   . Arthritis   . Diabetes mellitus without complication (HCC)   . GERD (gastroesophageal reflux disease)   . Hypertension   . Vertigo    04/30/20- not in a couple of years    Past Surgical History:  Procedure Laterality Date  . ABDOMINAL HYSTERECTOMY    . CHOLECYSTECTOMY    . COLONOSCOPY    . ROTATOR CUFF REPAIR Right     Prior to Admission medications   Medication Sig Start Date End Date Taking? Authorizing Provider  aspirin 81 MG chewable tablet Chew 81 mg by mouth daily.   Yes [provider]  azelastine (ASTELIN) 0.1 % nasal spray Place 2 sprays into both nostrils daily. 03/22/20  Yes [provider]  cholecalciferol (VITAMIN D) 25 MCG (1000 UNIT) tablet Take 1,000 Units by mouth daily.   Yes [provider]  DEXILANT 60 MG capsule Take 1 capsule by mouth daily. 03/08/20  Yes [provider]  diclofenac Sodium (VOLTAREN) 1 % GEL Apply 2 g topically 4 (four) times daily. Patient taking differently: Apply 2 g topically daily as needed (Pain). 11/14/19  Yes Tarry Kos, MD  FEROSUL 325 (65 Fe) MG tablet Take 325 mg by mouth at bedtime. 02/05/20  Yes [provider]  fluticasone (FLONASE) 50 MCG/ACT nasal spray Place 2 sprays into both nostrils at bedtime. 03/22/20  Yes [provider]  HUMALOG MIX 75/25 KWIKPEN (75-25) 100 UNIT/ML Kwikpen Inject 20-50 Units into the skin See admin instructions. Take 50 units in the morning 20 units at noon and 30 units at bedtime 03/18/20  Yes [provider]  loratadine (CLARITIN) 10 MG tablet Take 10 mg by mouth at bedtime.   Yes [provider]  losartan (COZAAR) 50 MG tablet Take 75 mg by mouth daily.   Yes [provider]  metFORMIN (GLUCOPHAGE-XR) 750 MG 24 hr tablet Take 750 mg by mouth 2 (two) times daily. 03/05/20  Yes [provider]  montelukast (SINGULAIR) 10 MG tablet Take 10 mg by mouth at bedtime. 02/05/20  Yes [provider]  rosuvastatin (CRESTOR) 5 MG tablet Take 5 mg by mouth daily. 03/08/20  Yes [provider]  SYMBICORT 160-4.5 MCG/ACT inhaler Inhale 2 puffs into the lungs 2 (two) times daily. 04/22/20  Yes [provider]  vitamin B-12 (CYANOCOBALAMIN) 1000 MCG tablet Take 1,000 mcg by mouth 2 (two) times daily.   Yes [provider]  albuterol (VENTOLIN HFA) 108 (90 Base) MCG/ACT inhaler Inhale 2 puffs into the lungs every 6 (six) hours as needed for wheezing or shortness of breath.    [provider]  meclizine (ANTIVERT) 25 MG tablet Take 25 mg by mouth 3 (three) times daily as needed for dizziness.    [provider]   Allergies  Allergen Reactions  . Oxycodone Other (See Comments)    lethargey  . Tramadol Anxiety  . Tuna Flavor [Flavoring Agent] Diarrhea and Nausea And Vomiting  . Penicillins Hives, Swelling and Rash    Reaction: patient was 75  years old    Social History   Tobacco Use  . Smoking status: Never Smoker  . Smokeless tobacco: Never Used  Substance Use Topics  . Alcohol use: No    History reviewed. No pertinent family history.   Review of Systems  Positive ROS: nwg  All other systems have been reviewed and were otherwise negative with the exception of those mentioned in the HPI and as above.  Objective: Vital signs in last 24 hours: Temp:  [98.7 F (37.1 C)] 98.7 F (37.1 C) (03/09 0802) Pulse Rate:  [92] 92 (03/09 0802) Resp:  [18] 18 (03/09 0802) BP: (146)/(64) 146/64 (03/09 0802) SpO2:  [100  %] 100 % (03/09 0802) Weight:  [108.9 kg] 108.9 kg (03/09 0807)  General Appearance: Alert, cooperative, no distress, appears stated age Head: Normocephalic, without obvious abnormality, atraumatic Eyes: PERRL, conjunctiva/corneas clear, EOM's intact    Neck: Supple, symmetrical, trachea midline Back: Symmetric, no curvature, ROM normal, no CVA tenderness Lungs:  respirations unlabored Heart: Regular rate and rhythm Abdomen: Soft, non-tender Extremities: Extremities normal, atraumatic, no cyanosis or edema Pulses: 2+ and symmetric all extremities Skin: Skin color, texture, turgor normal, no rashes or lesions  NEUROLOGIC:   Mental status: Alert and oriented x4,  no aphasia, good attention span, fund of knowledge, and memory Motor Exam - grossly normal Sensory Exam - grossly normal Reflexes: trace Coordination - grossly normal Gait - grossly normal Balance - grossly normal Cranial Nerves: I: smell Not tested  II: visual acuity  OS: nl    OD: nl  II: visual fields Full to confrontation  II: pupils Equal, round, reactive to light  III,VII: ptosis None  III,IV,VI: extraocular muscles  Full ROM  V: mastication Normal  V: facial light touch sensation  Normal  V,VII: corneal reflex  Present  VII: facial muscle function - upper  Normal  VII: facial muscle function - lower Normal  VIII: hearing Not tested  IX: soft palate elevation  Normal  IX,X: gag reflex Present  XI: trapezius strength  5/5  XI: sternocleidomastoid strength 5/5  XI: neck flexion strength  5/5  XII: tongue strength  Normal    Data Review Lab Results  Component Value Date   WBC 7.4 05/01/2020   HGB 11.9 (L) 05/01/2020   HCT 37.0 05/01/2020   MCV 87.7 05/01/2020   PLT 280 05/01/2020   Lab Results  Component Value Date   NA 136 05/01/2020   K 3.6 05/01/2020   CL 101 05/01/2020   CO2 26 05/01/2020   BUN 9 05/01/2020   CREATININE 0.75 05/01/2020   GLUCOSE 265 (H) 05/01/2020   Lab Results  Component  Value Date   INR 1.0 05/01/2020    Assessment/Plan:  Estimated body mass index is 39.94 kg/m as calculated from the following:   Height as of this encounter: 5\' 5"  (1.651 m).   Weight as of this encounter: 108.9 kg. Patient admitted for LL L3-4 l4-5 for stenosis. Patient has failed a reasonable attempt at conservative therapy.  I explained the condition and procedure to the patient and answered any questions.  Patient wishes to proceed with procedure as planned. Understands risks/ benefits and typical outcomes of procedure.   05/01/2020 8:29 AM

## 2020-05-01 NOTE — Progress Notes (Signed)
Inpatient Diabetes Program Recommendations  AACE/ADA: New Consensus Statement on Inpatient Glycemic Control   Target Ranges:  Prepandial:   less than 140 mg/dL      Peak postprandial:   less than 180 mg/dL (1-2 hours)      Critically ill patients:  140 - 180 mg/dL   Results for Tonya Chaney, Tonya Chaney (MRN [id number]) as of 05/01/2020 15:10  Ref. Range 05/01/2020 07:25 05/01/2020 10:48  Glucose-Capillary Latest Ref Range: 70 - 99 mg/dL 224 (H) 825 (H)   Review of Glycemic Control  Diabetes history: DM2 Outpatient Diabetes medications: Humalog 75/25 50 units QAM, 75/25 20 units at noon, 75/25 30 units at HS, Metformin XR 750 mg BID Current orders for Inpatient glycemic control: Metformin XR 750 mg BID, Novolog 0-15 units TID with meals  Inpatient Diabetes Program Recommendations:    Insulin: Please consider ordering Lantus 25 units QHS (based on 108.9 kg x 0.25 units).  Thanks, Orlando Penner, RN, MSN, CDE Diabetes Coordinator Inpatient Diabetes Program [phone] (Team Pager from 8am to 5pm)

## 2020-05-01 NOTE — Anesthesia Procedure Notes (Signed)
Procedure Name: Intubation Date/Time: 05/01/2020 8:47 AM Performed by: Glynda Jaeger, CRNA Pre-anesthesia Checklist: Patient identified, Patient being monitored, Timeout performed, Emergency Drugs available and Suction available Patient Re-evaluated:Patient Re-evaluated prior to induction Oxygen Delivery Method: Circle System Utilized Preoxygenation: Pre-oxygenation with 100% oxygen Induction Type: IV induction Ventilation: Mask ventilation without difficulty Laryngoscope Size: Mac and 4 Grade View: Grade I Tube type: Oral Tube size: 7.5 mm Number of attempts: 1 Airway Equipment and Method: Stylet Placement Confirmation: ETT inserted through vocal cords under direct vision,  positive ETCO2 and breath sounds checked- equal and bilateral Secured at: 21 cm Tube secured with: Tape Dental Injury: Teeth and Oropharynx as per pre-operative assessment

## 2020-05-01 NOTE — Anesthesia Postprocedure Evaluation (Signed)
Anesthesia Post Note  Patient: Digna Countess  Procedure(s) Performed: Laminectomy and Foraminotomy - Lumbar Two-three, Lumbar Three-Four bilateral (Bilateral Spine Lumbar)     Patient location during evaluation: PACU Anesthesia Type: General Level of consciousness: awake and alert Pain management: pain level controlled Vital Signs Assessment: post-procedure vital signs reviewed and stable Respiratory status: spontaneous breathing, nonlabored ventilation, respiratory function stable and patient connected to nasal cannula oxygen Cardiovascular status: blood pressure returned to baseline and stable Postop Assessment: no apparent nausea or vomiting Anesthetic complications: no   No complications documented.  Last Vitals:  Vitals:   05/01/20 1215 05/01/20 1230  BP: (!) 126/58   Pulse: 82 83  Resp: 12 11  Temp:  36.5 C  SpO2: 94% 94%    Last Pain:  Vitals:   05/01/20 1215  TempSrc:   PainSc: Asleep                 Nelle Don Welda Azzarello

## 2020-05-01 NOTE — Op Note (Signed)
05/01/2020  10:32 AM  PATIENT:  Tonya Chaney  75 y.o. female  PRE-OPERATIVE DIAGNOSIS: Lumbar spinal stenosis L2-3 L3-4 with right radiculopathy  POST-OPERATIVE DIAGNOSIS:  same  PROCEDURE: Right L2-3 L3-4 hemilaminectomy medial facetectomy and foraminotomies followed by sublaminar decompression  SURGEON:  Marikay Alar, MD  ASSISTANTS: Dr. Maisie Fus  ANESTHESIA:   General  EBL: 50 ml  Total I/O In: 400 [I.V.:200; IV Piggyback:200] Out: 50 [Blood:50]  BLOOD ADMINISTERED: none  DRAINS: None  SPECIMEN:  none  INDICATION FOR PROCEDURE: This patient presented with back and right leg pain. Imaging showed severe spinal stenosis L2-3 L3-4. The patient tried conservative measures without relief. Pain was debilitating. Recommended decompressive laminectomy. Patient understood the risks, benefits, and alternatives and potential outcomes and wished to proceed.  PROCEDURE DETAILS: The patient was taken to the operating room and after induction of adequate generalized endotracheal anesthesia, the patient was rolled into the prone position on the Wilson frame and all pressure points were padded. The lumbar region was cleaned and then prepped with DuraPrep and draped in the usual sterile fashion. 5 cc of local anesthesia was injected and then a dorsal midline incision was made and carried down to the lumbo sacral fascia. The fascia was opened and the paraspinous musculature was taken down in a subperiosteal fashion to expose L2-3 L3-4 on the right. Intraoperative x-ray confirmed my level, and then I used a combination of the high-speed drill and the Kerrison punches to perform a hemilaminectomy, medial facetectomy, and foraminotomy at L2-3 L3-4 on the right. The underlying yellow ligament was opened and removed in a piecemeal fashion to expose the underlying dura and exiting nerve root. I undercut the lateral recess and dissected down until I was medial to and distal to the pedicle. The nerve root was  well decompressed.  I drilled up under the spinous process and performed a sublaminar decompression at both levels to decompress the left lateral recess.  I then palpated with a coronary dilator along the nerve root and into the foramen to assure adequate decompression. I felt no more compression of the nerve root. I irrigated with saline solution containing bacitracin. Achieved hemostasis with bipolar cautery, lined the dura with Gelfoam, and then closed the fascia with 0 Vicryl. I closed the subcutaneous tissues with 2-0 Vicryl and the subcuticular tissues with 3-0 Vicryl. The skin was then closed with benzoin and Steri-Strips. The drapes were removed, a sterile dressing was applied.  My nurse practitioner and Dr. Maisie Fus was involved in the exposure, safe retraction of the neural elements, the disc work and the closure. the patient was awakened from general anesthesia and transferred to the recovery room in stable condition. At the end of the procedure all sponge, needle and instrument counts were correct.    PLAN OF CARE: Admit for overnight observation  PATIENT DISPOSITION:  PACU - hemodynamically stable.   Delay start of Pharmacological VTE agent (>24hrs) due to surgical blood loss or risk of bleeding:  yes

## 2020-05-01 NOTE — Progress Notes (Signed)
Pharmacy Antibiotic Note  Tonya Chaney is a 75 y.o. female admitted on 05/01/2020 status post back surgery to receive surgical prophylaxis.  Pharmacy has been consulted for Vancomycin dosing.  Pre-op Vancomycin 1000 mg given at 0833 AM.  SCr is within normal limits.  Incision was closed. No drain in place.   Plan: Vancomycin 1000 mg IV at 2030 PM (12 hours after preop dose) x1.  No further vancomycin as no drain in place.  Pharmacy will sign off - please re-consult if needed.   Height: 5\' 5"  (165.1 cm) Weight: 108.9 kg (240 lb) IBW/kg (Calculated) : 57  Temp (24hrs), Avg:98 F (36.7 C), Min:97.5 F (36.4 C), Max:98.7 F (37.1 C)  Recent Labs  Lab 05/01/20 0719  WBC 7.4  CREATININE 0.75    Estimated Creatinine Clearance: 76.9 mL/min (by C-G formula based on SCr of 0.75 mg/dL).    Allergies  Allergen Reactions  . Oxycodone Other (See Comments)    lethargey  . Tramadol Anxiety  . Tuna Flavor [Flavoring Agent] Diarrhea and Nausea And Vomiting  . Penicillins Hives, Swelling and Rash    Reaction: patient was 75 years old    Thank you for allowing pharmacy to be a part of this patient's care.  15 05/01/2020 1:15 PM

## 2020-05-01 NOTE — Transfer of Care (Signed)
Immediate Anesthesia Transfer of Care Note  Patient: Tonya Chaney  Procedure(s) Performed: Laminectomy and Foraminotomy - Lumbar Two-three, Lumbar Three-Four bilateral (Bilateral Spine Lumbar)  Patient Location: PACU  Anesthesia Type:General  Level of Consciousness: awake, alert , oriented, patient cooperative and responds to stimulation  Airway & Oxygen Therapy: Patient Spontanous Breathing and Patient connected to face mask oxygen  Post-op Assessment: Report given to RN, Post -op Vital signs reviewed and stable and Patient moving all extremities X 4  Post vital signs: Reviewed and stable  Last Vitals:  Vitals Value Taken Time  BP 136/114 05/01/20 1048  Temp 36.4 C 05/01/20 1045  Pulse 107 05/01/20 1047  Resp 20 05/01/20 1047  SpO2 100 % 05/01/20 1047  Vitals shown include unvalidated device data.  Last Pain:  Vitals:   05/01/20 0802  TempSrc: Oral  PainSc:          Complications: No complications documented.

## 2020-05-02 ENCOUNTER — Encounter (HOSPITAL_COMMUNITY): Payer: Self-pay | Admitting: Neurological Surgery

## 2020-05-02 DIAGNOSIS — M48061 Spinal stenosis, lumbar region without neurogenic claudication: Secondary | ICD-10-CM | POA: Diagnosis not present

## 2020-05-02 LAB — GLUCOSE, CAPILLARY: Glucose-Capillary: 325 mg/dL — ABNORMAL HIGH (ref 70–99)

## 2020-05-02 MED ORDER — LIVING WELL WITH DIABETES BOOK
Freq: Once | Status: AC
  Filled 2020-05-02: qty 1

## 2020-05-02 MED ORDER — METHOCARBAMOL 500 MG PO TABS: 500.0000 mg | ORAL_TABLET | Freq: Four times a day (QID) | ORAL | 0 refills | Status: DC | PRN

## 2020-05-02 NOTE — Progress Notes (Signed)
Patient is discharged from room 3C04 at this time. Alert and inn stable condition. IV site d/c'd and instructions read to patient with understanding verbalized and all questions answered. Left unit via wheelchair with all belongings at side.

## 2020-05-02 NOTE — Progress Notes (Signed)
Inpatient Diabetes Program Recommendations  AACE/ADA: New Consensus Statement on Inpatient Glycemic Control (2015)  Target Ranges:  Prepandial:   less than 140 mg/dL      Peak postprandial:   less than 180 mg/dL (1-2 hours)      Critically ill patients:  140 - 180 mg/dL   Lab Results  Component Value Date   GLUCAP 325 (H) 05/02/2020   HGBA1C 11.7 (H) 05/01/2020    Review of Glycemic Control  Inpatient Diabetes Program Recommendations:   Spoke with pt via phone (DM coordinator @ [facility]) about A1C results 11.7 (average blood glucose 289 over the past 2-3 months) with them and explained what an A1C is, basic pathophysiology of DM Type 2, basic home care, basic diabetes diet nutrition principles, importance of checking CBGs and maintaining good CBG control to prevent long-term and short-term complications. Reviewed signs and symptoms of hyperglycemia and hypoglycemia and how to treat hypoglycemia at home. Also reviewed blood sugar goals at home.  Ordered Living Well With Diabetes per patient request.  Patient is currently keeping appointments with [facility] Management Outpatient program in attempt to decrease A1c and better glycemic control. Patient is aware of limiting carbohydrates and high glycemic foods and drinks. Patient appreciated discussion.  Thank you, Billy Fischer. Kavon Valenza, RN, MSN, CDE  Diabetes Coordinator Inpatient Glycemic Control Team Team Pager [phone] (8am-5pm) 05/02/2020 9:46 AM

## 2020-05-02 NOTE — Evaluation (Signed)
Occupational Therapy Evaluation Patient Details Name: Tonya Chaney MRN: [id number] DOB: 1945-02-28 Today's Date: 05/02/2020    History of Present Illness Pt is a 75 y/o female admitted for radiculpathy.  Patient s/p right hemilaminectomy L2-3, L3-4.  PMH includes: HTN, DM, vertigo.   Clinical Impression   PTA patient independent and driving. Admitted for above and limited by problem list below, including impaired balance, decreased activity tolerance, pain and precautions.  Patient educated on back precautions, ADl compensatory techniques, recommendations, DME, safety and energy conservation.  Patient currently requires up to min assist for transfers, min guard for in room mobility, setup for UB ADLs seated and up to min assist for LB ADLs.  Patient reports she can have 24/7 support from daughter at discharge, family with assist with ADLs as needed; pt agreeable to have support with transfers, mobility and ADLs at time of dc. She will benefit from further OT services while admitted and after dc at [facility] level to optimize independence, safety with ADLs.      Follow Up Recommendations  Home health OT;Supervision/Assistance - 24 hour    Equipment Recommendations  3 in 1 bedside commode;Other (comment) (RW)    Recommendations for Other Services PT consult     Precautions / Restrictions Precautions Precautions: Back Precaution Booklet Issued: Yes (comment) Precaution Comments: reviewed with pt, requires cueing during functional tasks Required Braces or Orthoses:  (no brace needed) Restrictions Weight Bearing Restrictions: No      Mobility Bed Mobility               General bed mobility comments: EOB upon entry    Transfers Overall transfer level: Needs assistance Equipment used: Rolling walker (2 wheeled) Transfers: Sit to/from Stand Sit to Stand: Min assist;Min guard         General transfer comment: min assist to min guard for safety, cueing for hand placement,  posture and technique    Balance Overall balance assessment: Needs assistance Sitting-balance support: Feet supported;No upper extremity supported Sitting balance-Leahy Scale: Fair     Standing balance support: Bilateral upper extremity supported;During functional activity;No upper extremity supported Standing balance-Leahy Scale: Poor Standing balance comment: relies on BUE support                           ADL either performed or assessed with clinical judgement   ADL Overall ADL's : Needs assistance/impaired     Grooming: Min guard;Standing   Upper Body Bathing: Set up;Sitting   Lower Body Bathing: Minimal assistance;Sit to/from stand;Cueing for compensatory techniques;Adhering to back precautions   Upper Body Dressing : Set up;Sitting   Lower Body Dressing: Minimal assistance;Sit to/from stand;Cueing for back precautions   Toilet Transfer: Minimal assistance;Ambulation;RW;BSC   Toileting- Clothing Manipulation and Hygiene: Minimal assistance;Sit to/from stand       Functional mobility during ADLs: Minimal assistance;Min guard;Rolling walker;Cueing for safety General ADL Comments: patient educated on precautions and compesnatory techniques for ADLs, relies on BUE support and becomes unsteady with 1 UE support with ADL tasks     Vision         Perception     Praxis      Pertinent Vitals/Pain Pain Assessment: Faces Faces Pain Scale: Hurts little more Pain Location: back Pain Descriptors / Indicators: Operative site guarding;Discomfort Pain Intervention(s): Limited activity within patient's tolerance;Repositioned;Monitored during session     Hand Dominance Right   Extremity/Trunk Assessment Upper Extremity Assessment Upper Extremity Assessment: Overall WFL for tasks assessed  Lower Extremity Assessment Lower Extremity Assessment: Defer to PT evaluation   Cervical / Trunk Assessment Cervical / Trunk Assessment: Other exceptions Cervical /  Trunk Exceptions: s/p back surgery   Communication Communication Communication: No difficulties   Cognition Arousal/Alertness: Awake/alert Behavior During Therapy: WFL for tasks assessed/performed Overall Cognitive Status: Within Functional Limits for tasks assessed                                     General Comments       Exercises     Shoulder Instructions      Home Living Family/patient expects to be discharged to:: Private residence Living Arrangements: Other (Comment) (grandkids (8 and 9)) Available Help at Discharge: Family;Available 24 hours/day (reports daughter can provide 24/7 initally) Type of Home: House Home Access: Stairs to enter [employer] of Steps: 1 Entrance Stairs-Rails: None Home Layout: Two level;Able to live on main level with bedroom/bathroom     Bathroom Shower/Tub: Chief Strategy Officer: Standard     Home Equipment: Cane - single point;Shower seat;Grab bars - tub/shower          Prior Functioning/Environment Level of Independence: Independent with assistive device(s)        Comments: using cane, driving  and independent        OT Problem List: Decreased activity tolerance;Impaired balance (sitting and/or standing);Decreased knowledge of use of DME or AE;Decreased knowledge of precautions;Pain;Obesity      OT Treatment/Interventions: Self-care/ADL training;DME and/or AE instruction;Therapeutic activities;Balance training;Patient/family education    OT Goals(Current goals can be found in the care plan section) Acute Rehab OT Goals Patient Stated Goal: home today OT Goal Formulation: With patient Time For Goal Achievement: 05/16/20 Potential to Achieve Goals: Good  OT Frequency: Min 2X/week   Barriers to D/C:            Co-evaluation              AM-PAC OT "6 Clicks" Daily Activity     Outcome Measure Help from another person eating meals?: None Help from another person taking  care of personal grooming?: A Little Help from another person toileting, which includes using toliet, bedpan, or urinal?: A Little Help from another person bathing (including washing, rinsing, drying)?: A Little Help from another person to put on and taking off regular upper body clothing?: None Help from another person to put on and taking off regular lower body clothing?: A Little 6 Click Score: 20   End of Session Equipment Utilized During Treatment: Gait belt;Rolling walker Nurse Communication: Mobility status  Activity Tolerance: Patient tolerated treatment well Patient left: with call bell/phone within reach;with chair alarm set;Other (comment) (seated EOB)  OT Visit Diagnosis: Other abnormalities of gait and mobility (R26.89);Muscle weakness (generalized) (M62.81);Pain                Time: 5701-7793 OT Time Calculation (min): 21 min Charges:  OT General Charges $OT Visit: 1 Visit OT Evaluation $OT Eval Moderate Complexity: 1 Mod  Barry Brunner, OT Acute Rehabilitation Services Pager [phone] Office [phone]   Chancy Milroy 05/02/2020, 11:06 AM

## 2020-05-02 NOTE — Evaluation (Signed)
Physical Therapy Evaluation Patient Details Name: Tonya Chaney MRN: [id number] DOB: 12/05/1945 Today's Date: 05/02/2020   History of Present Illness  Pt is a 75 y/o female admitted for radiculpathy.  Patient s/p right hemilaminectomy L2-3, L3-4 on 05/01/2020.  PMH includes: HTN, DM, vertigo.    Clinical Impression  Pt admitted with above diagnosis. At the time of PT eval, pt was able to demonstrate transfers and ambulation with gross min guard assist to supervision for safety and RW for support. Pt was educated on precautions, appropriate activity progression, and car transfer. Pt currently with functional limitations due to the deficits listed below (see PT Problem List). Pt will benefit from skilled PT to increase their independence and safety with mobility to allow discharge to the venue listed below.     Follow Up Recommendations Home health PT;Supervision for mobility/OOB    Equipment Recommendations  Rolling walker with 5" wheels    Recommendations for Other Services       Precautions / Restrictions Precautions Precautions: Back Precaution Booklet Issued: Yes (comment) Precaution Comments: reviewed with pt, requires cueing during functional tasks Required Braces or Orthoses:  (no brace needed) Restrictions Weight Bearing Restrictions: No      Mobility  Bed Mobility Overal bed mobility: Needs Assistance Bed Mobility: Rolling;Sidelying to Sit Rolling: Supervision Sidelying to sit: Supervision       General bed mobility comments: HOB slightly elevated; no assist but increased time required.    Transfers Overall transfer level: Needs assistance Equipment used: Rolling walker (2 wheeled) Transfers: Sit to/from Stand Sit to Stand: Min guard         General transfer comment: Close guard for safety as pt powered up to full stand. No assist required. VC's for hand placement on seated surface for safety.  Ambulation/Gait Ambulation/Gait assistance: Min  guard;Supervision Gait Distance (Feet): 250 Feet Assistive device: Rolling walker (2 wheeled) Gait Pattern/deviations: Step-through pattern;Decreased stride length;Trunk flexed Gait velocity: Decreased Gait velocity interpretation: <1.31 ft/sec, indicative of household ambulator General Gait Details: VC's for closer walker proximity, improved posture, and forward gaze. Pt ambulating slow but generally steady.  Stairs            Wheelchair Mobility    Modified Rankin (Stroke Patients Only)       Balance Overall balance assessment: Needs assistance Sitting-balance support: Feet supported;No upper extremity supported Sitting balance-Leahy Scale: Fair     Standing balance support: Bilateral upper extremity supported;During functional activity;No upper extremity supported Standing balance-Leahy Scale: Poor Standing balance comment: relies on BUE support                             Pertinent Vitals/Pain Pain Assessment: Faces Faces Pain Scale: Hurts little more Pain Location: back Pain Descriptors / Indicators: Operative site guarding;Sore Pain Intervention(s): Limited activity within patient's tolerance;Monitored during session;Repositioned    Home Living Family/patient expects to be discharged to:: Private residence Living Arrangements: Other (Comment) (grandkids (8 and 9)) Available Help at Discharge: Family;Available 24 hours/day (reports daughter can provide 24/7 initally) Type of Home: House Home Access: Stairs to enter Entrance Stairs-Rails: None Entrance Stairs-Number of Steps: 1 Home Layout: Two level;Able to live on main level with bedroom/bathroom Home Equipment: Gilmer Mor - single point;Shower seat;Grab bars - tub/shower      Prior Function Level of Independence: Independent with assistive device(s)         Comments: using cane, driving  and independent     Hand Dominance  Dominant Hand: Right    Extremity/Trunk Assessment   Upper  Extremity Assessment Upper Extremity Assessment: Defer to OT evaluation    Lower Extremity Assessment Lower Extremity Assessment: Generalized weakness    Cervical / Trunk Assessment Cervical / Trunk Assessment: Other exceptions Cervical / Trunk Exceptions: s/p back surgery  Communication   Communication: No difficulties  Cognition Arousal/Alertness: Awake/alert Behavior During Therapy: WFL for tasks assessed/performed Overall Cognitive Status: Within Functional Limits for tasks assessed                                        General Comments      Exercises     Assessment/Plan    PT Assessment Patient needs continued PT services  PT Problem List Decreased strength;Decreased activity tolerance;Decreased balance;Decreased mobility;Decreased knowledge of use of DME;Decreased knowledge of precautions;Decreased safety awareness;Pain       PT Treatment Interventions DME instruction;Gait training;Functional mobility training;Therapeutic activities;Therapeutic exercise;Balance training;Neuromuscular re-education;Patient/family education    PT Goals (Current goals can be found in the Care Plan section)  Acute Rehab PT Goals Patient Stated Goal: home today PT Goal Formulation: With patient Time For Goal Achievement: 05/09/20 Potential to Achieve Goals: Good    Frequency Min 5X/week   Barriers to discharge        Co-evaluation               AM-PAC PT "6 Clicks" Mobility  Outcome Measure Help needed turning from your back to your side while in a flat bed without using bedrails?: A Little Help needed moving from lying on your back to sitting on the side of a flat bed without using bedrails?: A Little Help needed moving to and from a bed to a chair (including a wheelchair)?: A Little Help needed standing up from a chair using your arms (e.g., wheelchair or bedside chair)?: A Little Help needed to walk in hospital room?: A Little Help needed climbing 3-5  steps with a railing? : A Little 6 Click Score: 18    End of Session Equipment Utilized During Treatment: Gait belt Activity Tolerance: Patient tolerated treatment well Patient left: in chair;with call bell/phone within reach Nurse Communication: Mobility status PT Visit Diagnosis: Unsteadiness on feet (R26.81);Pain Pain - part of body:  (back)    Time: 0912-0928 PT Time Calculation (min) (ACUTE ONLY): 16 min   Charges:   PT Evaluation $PT Eval Low Complexity: 1 Low          Conni Slipper, PT, DPT Acute Rehabilitation Services Pager: [phone] Office: [phone]   Marylynn Pearson 05/02/2020, 11:41 AM

## 2020-05-02 NOTE — TOC Initial Note (Signed)
Transition of Care [facility]) - Initial/Assessment Note    Patient Details  Name: Tonya Chaney MRN: [id number] Date of Birth: [birth year]  Transition of Care [facility]) CM/SW Contact:    Joanne Chars, LCSW Phone Number: 05/02/2020, 10:10 AM  Clinical Narrative:   CSW informed by RN that pt needs HH.  PT/OT note not yet in.  Met with pt to discuss, she is agreeable to [facility] services, no preference for agency. Choice document given.  Pt lives with to minor great grandchildren but has 3 daughters, one of whom (Tamika) will be staying with her at discharge.  Pt is vaccinated for covid and boosted.  PCP in place.  [facility]my declines referral due to payor. [facility] accepts referral.                Expected Discharge Plan: [place] [facility] Barriers to Discharge: Other (comment) ([employer] medicare needing HH)   Patient Goals and CMS Choice Patient states their goals for this hospitalization and ongoing recovery are:: "get back to walking" CMS Medicare.gov Compare Post Acute Care list provided to:: Patient Choice offered to / list presented to : Patient  Expected Discharge Plan and Services Expected Discharge Plan: [place] Choice: [place] arrangements for the past 2 months: Single Family Home Expected Discharge Date: 05/02/20                                    Prior Living Arrangements/Services Living arrangements for the past 2 months: Single Family Home Lives with:: Minor Children Patient language and need for interpreter reviewed:: Yes Do you feel safe going back to the place where you live?: Yes      Need for Family Participation in Patient Care: Yes (Comment) Care giver support system in place?: Yes (comment) Current home services: Other (comment) (None) Criminal Activity/Legal Involvement Pertinent to Current Situation/Hospitalization: No - Comment as needed  Activities of Daily Living Home Assistive  Devices/Equipment: Cane (specify quad or straight),CBG Meter,Eyeglasses,Blood pressure cuff,Dentures (specify type),Grab bars in shower,Grab bars around toilet,Shower chair with back,Walker (specify type) ADL Screening (condition at time of admission) Patient's cognitive ability adequate to safely complete daily activities?: Yes Is the patient deaf or have difficulty hearing?: No Does the patient have difficulty seeing, even when wearing glasses/contacts?: No Does the patient have difficulty concentrating, remembering, or making decisions?: No Patient able to express need for assistance with ADLs?: Yes Does the patient have difficulty dressing or bathing?: Yes Independently performs ADLs?: No Communication: Independent Dressing (OT): Needs assistance Is this a change from baseline?: Change from baseline, expected to last <3days Grooming: Independent Feeding: Independent Bathing: Needs assistance Is this a change from baseline?: Change from baseline, expected to last <3 days Toileting: Needs assistance Is this a change from baseline?: Change from baseline, expected to last <3 days In/Out Bed: Needs assistance Is this a change from baseline?: Change from baseline, expected to last <3 days Does the patient have difficulty walking or climbing stairs?: Yes Weakness of Legs: Right Weakness of Arms/Hands: Right  Permission Sought/Granted Permission sought to share information with : Family Supports Permission granted to share information with : Yes, Verbal Permission Granted  Share Information with NAME: 3 daughters-Tamika, Cassandra, did not get third daughter name  Permission granted to share info w AGENCY: [facility]        Emotional Assessment Appearance:: Appears stated  age Attitude/Demeanor/Rapport: Engaged Affect (typically observed): Appropriate,Pleasant Orientation: : Oriented to Self,Oriented to Place,Oriented to  Time,Oriented to Situation Alcohol / Substance Use: Not  Applicable Psych Involvement: No (comment)  Admission diagnosis:  S/P lumbar laminectomy [Z98.890] Patient Active Problem List   Diagnosis Date Noted  . S/P lumbar laminectomy 05/01/2020  . 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

## 2020-05-02 NOTE — Discharge Summary (Signed)
Physician Discharge Summary  Patient ID: Tonya Chaney MRN: [id number] DOB/AGE: 05/09/1945 75 y.o.  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 The patient tolerated the procedure well and was taken to the recovery room and then to the floor in stable condition. The hospital course was routine. There were no complications. The wound remained clean dry and intact. Pt had appropriate back soreness. No complaints of leg pain or new N/T/W. The patient remained afebrile with stable vital signs, and tolerated a regular diet. The patient continued to increase activities, and pain was well controlled with oral pain medications.   Consults: None  Significant Diagnostic Studies:  Results for orders placed or performed during the hospital encounter of 05/01/20  Surgical pcr screen   Specimen: Nasal Mucosa; Nasal Swab  Result Value Ref Range   MRSA, PCR NEGATIVE NEGATIVE   Staphylococcus aureus NEGATIVE NEGATIVE  Basic metabolic panel  Result Value Ref Range   Sodium 136 135 - 145 mmol/L   Potassium 3.6 3.5 - 5.1 mmol/L   Chloride 101 98 - 111 mmol/L   CO2 26 22 - 32 mmol/L   Glucose, Bld 265 (H) 70 - 99 mg/dL   BUN 9 8 - 23 mg/dL   Creatinine, Ser 4.50 0.44 - 1.00 mg/dL   Calcium 9.0 8.9 - 38.8 mg/dL   GFR, Estimated >82 >80 mL/min   Anion gap 9 5 - 15  CBC WITH DIFFERENTIAL  Result Value Ref Range   WBC 7.4 4.0 - 10.5 K/uL   RBC 4.22 3.87 - 5.11 MIL/uL   Hemoglobin 11.9 (L) 12.0 - 15.0 g/dL   HCT 03.4 91.7 - 91.5 %   MCV 87.7 80.0 - 100.0 fL   MCH 28.2 26.0 - 34.0 pg   MCHC 32.2 30.0 - 36.0 g/dL   RDW 05.6 97.9 - 48.0 %   Platelets 280 150 - 400 K/uL   nRBC 0.0 0.0 - 0.2 %   Neutrophils  Relative % 48 %   Neutro Abs 3.5 1.7 - 7.7 K/uL   Lymphocytes Relative 37 %   Lymphs Abs 2.8 0.7 - 4.0 K/uL   Monocytes Relative 6 %   Monocytes Absolute 0.5 0.1 - 1.0 K/uL   Eosinophils Relative 8 %   Eosinophils Absolute 0.6 (H) 0.0 - 0.5 K/uL   Basophils Relative 1 %   Basophils Absolute 0.0 0.0 - 0.1 K/uL   Immature Granulocytes 0 %   Abs Immature Granulocytes 0.02 0.00 - 0.07 K/uL  Protime-INR  Result Value Ref Range   Prothrombin Time 13.1 11.4 - 15.2 seconds   INR 1.0 0.8 - 1.2  Glucose, capillary  Result Value Ref Range   Glucose-Capillary 251 (H) 70 - 99 mg/dL   Comment 1 Notify RN   Glucose, capillary  Result Value Ref Range   Glucose-Capillary 245 (H) 70 - 99 mg/dL  Hemoglobin X6P  Result Value Ref Range   Hgb A1c MFr Bld 11.7 (H) 4.8 - 5.6 %   Mean Plasma Glucose 289.09 mg/dL  Glucose, capillary  Result Value Ref Range   Glucose-Capillary 270 (H) 70 - 99 mg/dL  Glucose, capillary  Result Value Ref Range   Glucose-Capillary 259 (H) 70 - 99 mg/dL   Comment 1 Notify RN  Comment 2 Document in Chart   Glucose, capillary  Result Value Ref Range   Glucose-Capillary 325 (H) 70 - 99 mg/dL   Comment 1 Notify RN    Comment 2 Document in Chart     Chest 2 View  Result Date: 05/01/2020 CLINICAL DATA:  Preoperative study for lumbar spine surgery. EXAM: CHEST - 2 VIEW COMPARISON:  Chest x-ray 01/28/2018.  CT 04/16/2016. FINDINGS: Mediastinum hilar structures normal. Lungs are clear. No pleural effusion or pneumothorax. Stable mild elevation right hemidiaphragm. Heart size normal. Degenerative change thoracic spine and both shoulders. No acute bony abnormality. IMPRESSION: No acute cardiopulmonary disease. Electronically Signed   By: Maisie Fus  Register   On: 05/01/2020 07:54   DG Lumbar Spine 2-3 Views  Result Date: 05/01/2020 CLINICAL DATA:  Lumbar microdiscectomy. EXAM: LUMBAR SPINE - 2-3 VIEW COMPARISON:  Lumbar spine radiographs 06/06/2019 and MRI 03/27/2020 FINDINGS:  Two intraoperative cross-table lateral radiographs of the lumbar spine are provided. On the first image, the tip of a needle projects over the subcutaneous soft tissues posterior to the L3 spinous process. On the second image, the tip of a surgical instrument projects over the posterior elements at the L3-4 disc space level. IMPRESSION: Intraoperative radiographs as above. Electronically Signed   By: Sebastian Ache M.D.   On: 05/01/2020 12:23    Antibiotics:  Anti-infectives (From admission, onward)   Start     Dose/Rate Route Frequency Ordered Stop   05/01/20 2030  vancomycin (VANCOREADY) IVPB 1000 mg/200 mL        1,000 mg 200 mL/hr over 60 Minutes Intravenous  Once 05/01/20 1320 05/01/20 2210   05/01/20 0730  vancomycin (VANCOCIN) IVPB 1000 mg/200 mL premix        1,000 mg 200 mL/hr over 60 Minutes Intravenous On call to O.R. 05/01/20 0719 05/01/20 0933   05/01/20 0727  vancomycin (VANCOCIN) 1-5 GM/200ML-% IVPB       Note to Pharmacy: Clovis Cao   : cabinet override      05/01/20 0727 05/01/20 0909      Discharge Exam: Blood pressure (!) 147/80, pulse (!) 108, temperature 97.9 F (36.6 C), temperature source Oral, resp. rate 15, height 5\' 5"  (1.651 m), weight 108.9 kg, SpO2 97 %. Neurologic: Grossly normal Ambulating and voiding well, incision cdi  Discharge Medications:   Allergies as of 05/02/2020      Reactions   Oxycodone Other (See Comments)   lethargey   Tramadol Anxiety   Tuna Flavor [flavoring Agent] Diarrhea, Nausea And Vomiting   Penicillins Hives, Swelling, Rash   Reaction: patient was 75 years old      Medication List    TAKE these medications   albuterol 108 (90 Base) MCG/ACT inhaler Commonly known as: VENTOLIN HFA Inhale 2 puffs into the lungs every 6 (six) hours as needed for wheezing or shortness of breath.   aspirin 81 MG chewable tablet Chew 81 mg by mouth daily.   azelastine 0.1 % nasal spray Commonly known as: ASTELIN Place 2 sprays into both  nostrils daily.   cholecalciferol 25 MCG (1000 UNIT) tablet Commonly known as: VITAMIN D Take 1,000 Units by mouth daily.   Dexilant 60 MG capsule Generic drug: dexlansoprazole Take 1 capsule by mouth daily.   diclofenac Sodium 1 % Gel Commonly known as: VOLTAREN Apply 2 g topically 4 (four) times daily. What changed:   when to take this  reasons to take this   FeroSul 325 (65 FE) MG tablet Generic drug: ferrous sulfate Take  325 mg by mouth at bedtime.   fluticasone 50 MCG/ACT nasal spray Commonly known as: FLONASE Place 2 sprays into both nostrils at bedtime.   HumaLOG Mix 75/25 KwikPen (75-25) 100 UNIT/ML Kwikpen Generic drug: Insulin Lispro Prot & Lispro Inject 20-50 Units into the skin See admin instructions. Take 50 units in the morning 20 units at noon and 30 units at bedtime   loratadine 10 MG tablet Commonly known as: CLARITIN Take 10 mg by mouth at bedtime.   losartan 50 MG tablet Commonly known as: COZAAR Take 75 mg by mouth daily.   meclizine 25 MG tablet Commonly known as: ANTIVERT Take 25 mg by mouth 3 (three) times daily as needed for dizziness.   metFORMIN 750 MG 24 hr tablet Commonly known as: GLUCOPHAGE-XR Take 750 mg by mouth 2 (two) times daily.   methocarbamol 500 MG tablet Commonly known as: ROBAXIN Take 1 tablet (500 mg total) by mouth every 6 (six) hours as needed for muscle spasms.   montelukast 10 MG tablet Commonly known as: SINGULAIR Take 10 mg by mouth at bedtime.   rosuvastatin 5 MG tablet Commonly known as: CRESTOR Take 5 mg by mouth daily.   Symbicort 160-4.5 MCG/ACT inhaler Generic drug: budesonide-formoterol Inhale 2 puffs into the lungs 2 (two) times daily.   vitamin B-12 1000 MCG tablet Commonly known as: CYANOCOBALAMIN Take 1,000 mcg by mouth 2 (two) times daily.       Disposition: home   Final Dx: right hemilaminectomy L2-3, L3-4  Discharge Instructions     Remove dressing in 72 hours   Complete by: As  directed    Call MD for:  difficulty breathing, headache or visual disturbances   Complete by: As directed    Call MD for:  hives   Complete by: As directed    Call MD for:  persistant dizziness or light-headedness   Complete by: As directed    Call MD for:  persistant nausea and vomiting   Complete by: As directed    Call MD for:  redness, tenderness, or signs of infection (pain, swelling, redness, odor or green/yellow discharge around incision site)   Complete by: As directed    Call MD for:  severe uncontrolled pain   Complete by: As directed    Call MD for:  temperature >100.4   Complete by: As directed    Diet - low sodium heart healthy   Complete by: As directed    Driving Restrictions   Complete by: As directed    No driving for 2 weeks, no riding in the car for 1 week   Increase activity slowly   Complete by: As directed    Lifting restrictions   Complete by: As directed    No lifting more than 8 lbs         Signed: Tiana Loft Lataria Courser 05/02/2020, 7:46 AM

## 2020-05-02 NOTE — Discharge Instructions (Signed)
Wound Care Leave incision open to air. You may shower. Do not scrub directly on incision.  Do not put any creams, lotions, or ointments on incision. Activity Walk each and every day, increasing distance each day. No lifting greater than 5 lbs.  Avoid bending, arching, and twisting. No driving for 2 weeks; may ride as a passenger locally. Diet Resume your normal diet.  Return to Work Will be discussed at you follow up appointment. Call Your Doctor If Any of These Occur Redness, drainage, or swelling at the wound.  Temperature greater than 101 degrees. Severe pain not relieved by pain medication. Incision starts to come apart. Follow Up Appt Call today for appointment in 2-4 weeks ([phone]) or for problems.   

## 2020-09-03 ENCOUNTER — Ambulatory Visit: Payer: Medicare Other | Admitting: Neurology

## 2020-11-28 ENCOUNTER — Encounter: Payer: Self-pay | Admitting: Neurology

## 2020-11-28 ENCOUNTER — Ambulatory Visit (INDEPENDENT_AMBULATORY_CARE_PROVIDER_SITE_OTHER): Payer: Medicare Other | Admitting: Neurology

## 2020-11-28 ENCOUNTER — Telehealth: Payer: Self-pay | Admitting: Neurology

## 2020-11-28 VITALS — BP 147/67 | HR 89 | Ht 65.0 in | Wt 245.0 lb

## 2020-11-28 DIAGNOSIS — R269 Unspecified abnormalities of gait and mobility: Secondary | ICD-10-CM

## 2020-11-28 DIAGNOSIS — R202 Paresthesia of skin: Secondary | ICD-10-CM

## 2020-11-28 DIAGNOSIS — M542 Cervicalgia: Secondary | ICD-10-CM

## 2020-11-28 NOTE — Progress Notes (Signed)
Chief Complaint  Patient presents with   Peripheral Neuropathy    New patient:paper referral; pain in lower legs and feet for several years Room 16, alone in room      ASSESSMENT AND PLAN  Tonya Chaney is a 75 y.o. female   Gait abnormality  Hyperreflexia bilateral Babinski sign  Need to rule out cervical spondylitic myelopathy  MRI of cervical spine  Status post right L2-3, L3-4 hemilaminectomy, medial facetectomy, and foraminotomy in March 2022,  Continued right lower extremity paresthesia  Possible right lumbar radiculopathy  EMG nerve conduction study   DIAGNOSTIC DATA (LABS, IMAGING, TESTING) - I reviewed patient records, labs, notes, testing and imaging myself where available.   MEDICAL HISTORY:  Tonya Chaney is a 75 year old female, seen in request by her primary care physician Dr. Andrey Campanile, Ranae Pila, for evaluation of bilateral feet paresthesia, initial evaluation was on November 28, 2020   I reviewed and summarized the referring note. 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 Marikay Alar  She reported significant improvement of her low back pain, radiating pain to  right leg, but continues to have persistent right leg numbness radiating from right lateral  thigh into right lateral leg, to top of right foot, she felt right foot swelling,  Also complains of urinary urgency, occasional incontinence, continue have gait abnormality,  She also complains of neck pain, radiating pain to bilateral shoulder  PHYSICAL EXAM:   Vitals:   11/28/20 0942  BP: (!) 147/67  Pulse: 89  Weight: 245 lb (111.1 kg)  Height: 5\' 5"  (1.651 m)   Not recorded     Body mass index is 40.77 kg/m.  PHYSICAL EXAMNIATION:  Gen: NAD, conversant, well nourised, well groomed                     Cardiovascular: Regular rate rhythm, no peripheral edema, warm, nontender. Eyes: Conjunctivae clear without exudates or hemorrhage Neck: Supple, no carotid bruits. Pulmonary: Clear to auscultation bilaterally   NEUROLOGICAL EXAM:  MENTAL STATUS: Speech:    Speech is normal; fluent and spontaneous with normal comprehension.  Cognition:     Orientation to time, place and person     Normal recent and remote memory     Normal Attention span and concentration     Normal Language, naming, repeating,spontaneous speech     Fund of knowledge   CRANIAL NERVES: CN II: Visual fields are full to confrontation. Pupils are round equal and briskly reactive to light. CN III, IV, VI: extraocular movement are normal. No ptosis. CN V: Facial sensation is intact to light touch CN VII: Face is symmetric with normal eye closure  CN VIII: Hearing is normal to causal conversation. CN IX, X: Phonation is normal. CN XI: Head turning and shoulder shrug are intact  MOTOR: Limited examination of proximal left upper extremity due to left shoulder pain, there  was no significant bilateral lower extremity proximal and distal muscle weakness,  REFLEXES: Reflexes are 2+ and symmetric at the biceps, triceps, 3/3 knees, and trace at extensor ankles. Plantar responses are extensor bilaterally  SENSORY: Length dependent decreased to light touch, pinprick and vibratory sensation at  ankles  COORDINATION: There is no trunk or limb dysmetria noted.  GAIT/STANCE: She needs push-up to get up from seated position, flatfeet, wide-based, stiff, unsteady  REVIEW OF SYSTEMS:  Full 14 system review of systems performed and notable only for as above All other review of systems were negative.   ALLERGIES: Allergies  Allergen Reactions   Oxycodone Other (See Comments)    lethargey   Tramadol Anxiety   Tuna Flavor [Flavoring Agent] Diarrhea and Nausea And Vomiting   Penicillins Hives, Swelling and Rash    Reaction: patient was 75 years old    HOME MEDICATIONS: Current Outpatient Medications  Medication Sig Dispense Refill   albuterol (VENTOLIN HFA) 108 (90 Base) MCG/ACT inhaler Inhale 2 puffs into the lungs every 6 (six) hours as needed for wheezing or shortness of breath.     aspirin 81 MG chewable tablet Chew 81 mg by mouth daily.     azelastine (ASTELIN) 0.1 % nasal spray Place 2 sprays into both nostrils daily.     cholecalciferol (VITAMIN D) 25 MCG (1000 UNIT) tablet Take 1,000 Units by mouth daily.     DEXILANT 60 MG capsule Take 1 capsule by mouth daily.     diclofenac Sodium (VOLTAREN) 1 % GEL Apply 2 g topically 4 (four) times daily. (Patient taking differently: Apply 2 g topically daily as needed (Pain).) 2 g 5   FEROSUL 325 (65 Fe) MG tablet Take 325 mg by mouth at bedtime.     fluticasone (FLONASE) 50 MCG/ACT nasal spray Place 2 sprays into both nostrils at bedtime.     HUMALOG MIX 75/25 KWIKPEN (75-25) 100 UNIT/ML Kwikpen Inject 20-50 Units into the skin See admin instructions. Take 50 units in the morning 20 units at noon and 30 units at bedtime     losartan (COZAAR) 50 MG tablet Take 75 mg by mouth daily.     meclizine (ANTIVERT) 25 MG tablet Take 25 mg by mouth 3 (three) times daily as needed for dizziness.     metFORMIN (GLUCOPHAGE-XR) 750 MG 24 hr tablet Take 750 mg by mouth 2 (two) times daily.     methocarbamol (ROBAXIN) 500 MG tablet Take 1 tablet  (500 mg total) by mouth every 6 (six) hours as needed for muscle spasms. 45 tablet 0   montelukast (SINGULAIR) 10 MG tablet Take 10 mg by mouth at bedtime.     rosuvastatin (CRESTOR) 5 MG tablet Take 5 mg by mouth daily.     SYMBICORT 160-4.5 MCG/ACT inhaler Inhale 2 puffs into the lungs 2 (two) times daily.     vitamin B-12 (CYANOCOBALAMIN) 1000 MCG tablet Take 1,000 mcg by mouth 2 (two) times daily.     No current facility-administered medications for this visit.    PAST MEDICAL HISTORY: Past Medical History:  Diagnosis Date   Allergies    Arthritis    Diabetes mellitus without complication (HCC)    GERD (gastroesophageal reflux disease)    Hypertension    Vertigo    04/30/20- not in a couple of years    PAST SURGICAL HISTORY: Past Surgical History:  Procedure Laterality Date   ABDOMINAL HYSTERECTOMY     CHOLECYSTECTOMY     COLONOSCOPY  LUMBAR LAMINECTOMY/DECOMPRESSION MICRODISCECTOMY Bilateral 05/01/2020   Procedure: Laminectomy and Foraminotomy - Lumbar Two-three, Lumbar Three-Four bilateral;  Surgeon: Tia Alert, MD;  Location: [facility] OR;  Service: Neurosurgery;  Laterality: Bilateral;  posterior   ROTATOR CUFF REPAIR Right     FAMILY HISTORY: History reviewed. No pertinent family history.  SOCIAL HISTORY: Social History   Socioeconomic History   Marital status: Legally Separated    Spouse name: Not on file   Number of children: Not on file   Years of education: Not on file   Highest education level: Not on file  Occupational History   Not on file  Tobacco Use   Smoking status: Never   Smokeless tobacco: Never  Vaping Use   Vaping Use: Never used  Substance and Sexual Activity   Alcohol use: No   Drug use: Not on file   Sexual activity: Not on file  Other Topics Concern   Not on file  Social History Narrative   Lives with 2 great grandchildre   Right Handed   Drinks 1-2 cups caffeine   Social Determinants of Health   Financial Resource Strain: Not  on file  Food Insecurity: Not on file  Transportation Needs: Not on file  Physical Activity: Not on file  Stress: Not on file  Social Connections: Not on file  Intimate Partner Violence: Not on file      Levert Feinstein, M.D. Ph.D.  [facility] [address] [place], [place] [address] Ph: [phone] Fax: [phone]  CC:  Yolanda Manges, DO [address] [place],  [place] [address]  Yolanda Manges, DO

## 2020-11-28 NOTE — Telephone Encounter (Signed)
[employer] medicare/medicaid order sent to [facility]. [employer] they will reach out to the patient to schedule.  

## 2020-12-15 ENCOUNTER — Other Ambulatory Visit: Payer: Self-pay

## 2020-12-15 ENCOUNTER — Ambulatory Visit
Admission: RE | Admit: 2020-12-15 | Discharge: 2020-12-15 | Disposition: A | Discharge status: Home / Self Care | Payer: Medicare Other | Source: Ambulatory Visit | Attending: Neurology | Admitting: Neurology

## 2020-12-15 DIAGNOSIS — M542 Cervicalgia: Secondary | ICD-10-CM

## 2020-12-15 DIAGNOSIS — R269 Unspecified abnormalities of gait and mobility: Secondary | ICD-10-CM

## 2020-12-15 DIAGNOSIS — R202 Paresthesia of skin: Secondary | ICD-10-CM

## 2020-12-17 ENCOUNTER — Telehealth: Payer: Self-pay | Admitting: Neurology

## 2020-12-17 NOTE — Telephone Encounter (Signed)
-   At C7-T1 disc bulging and facet hypertrophy with moderate to severe spinal stenosis and severe bilateral foraminal stenosis. - At C6-7 disc bulging and facet hypertrophy with moderate spinal stenosis and severe bilateral foraminal stenosis. - At C5-6 disc bulging and facet hypertrophy with moderate spinal stenosis, and severe bilateral foraminal stenosis; subtle myelomalacia within spinal cord on the right side. - At C4-5 disc bulging, uncovertebral and facet hypertrophy with mild spinal stenosis and severe bilateral foraminal stenosis. - At C3-4 uncovertebral joint and facet hypertrophy with severe bilateral foraminal stenosis.   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

## 2020-12-18 NOTE — Telephone Encounter (Signed)
LVM for pt to call about results. °

## 2020-12-19 ENCOUNTER — Ambulatory Visit: Payer: Medicare Other | Attending: Neurology

## 2020-12-19 ENCOUNTER — Other Ambulatory Visit: Payer: Self-pay

## 2020-12-19 DIAGNOSIS — R262 Difficulty in walking, not elsewhere classified: Secondary | ICD-10-CM | POA: Insufficient documentation

## 2020-12-19 DIAGNOSIS — R2681 Unsteadiness on feet: Secondary | ICD-10-CM | POA: Insufficient documentation

## 2020-12-19 DIAGNOSIS — R269 Unspecified abnormalities of gait and mobility: Secondary | ICD-10-CM | POA: Insufficient documentation

## 2020-12-19 DIAGNOSIS — M542 Cervicalgia: Secondary | ICD-10-CM | POA: Diagnosis not present

## 2020-12-19 DIAGNOSIS — R2689 Other abnormalities of gait and mobility: Secondary | ICD-10-CM | POA: Insufficient documentation

## 2020-12-19 DIAGNOSIS — M6281 Muscle weakness (generalized): Secondary | ICD-10-CM | POA: Diagnosis present

## 2020-12-19 DIAGNOSIS — R202 Paresthesia of skin: Secondary | ICD-10-CM | POA: Diagnosis not present

## 2020-12-19 NOTE — Telephone Encounter (Signed)
Called and spoke with pt about results per Dr. Zannie Cove note. She has had no significant changes since visit on 11/28/20. Denies any bowel/urinary changes, no significant changes in gait. She will continue to monitor. If anything changes prior to EMG/NCS on 02/05/21, she will call to let us know.

## 2020-12-19 NOTE — Therapy (Signed)
[facility] [address] [place], [place], [address] Phone: [phone]   Fax:  [phone]  Physical Therapy Evaluation  Patient Details  Name: Tonya Chaney MRN: [id number] Date of Birth: 31-Mar-1945 Referring Provider (PT): Dr. Levert Feinstein   Encounter Date: 12/19/2020   PT End of Session - 12/19/20 1013     Visit Number 1    Number of Visits 17    Date for PT Re-Evaluation 02/14/21    Authorization Type [employer] medicare so 10th visit progress note    PT Start Time 1013    PT Stop Time 1104    PT Time Calculation (min) 51 min    Equipment Utilized During Treatment Gait belt    Activity Tolerance Patient tolerated treatment well    Behavior During Therapy WFL for tasks assessed/performed             Past Medical History:  Diagnosis Date   Allergies    Arthritis    Diabetes mellitus without complication (HCC)    GERD (gastroesophageal reflux disease)    Hypertension    Vertigo    04/30/20- not in a couple of years    Past Surgical History:  Procedure Laterality Date   ABDOMINAL HYSTERECTOMY     CHOLECYSTECTOMY     COLONOSCOPY     LUMBAR LAMINECTOMY/DECOMPRESSION MICRODISCECTOMY Bilateral 05/01/2020   Procedure: Laminectomy and Foraminotomy - Lumbar Two-three, Lumbar Three-Four bilateral;  Surgeon: Tia Alert, MD;  Location: [facility] OR;  Service: Neurosurgery;  Laterality: Bilateral;  posterior   ROTATOR CUFF REPAIR Right     There were no vitals filed for this visit.    Subjective Assessment - 12/19/20 1013     Subjective Pt presents with reports of balance problems. Feels like she loses her balance when she gets up. Pt reports that she was having some right hip problems she thought. MD did an MRI and they did an MRI.  She had been bending over to walk and could not straighten up. Pt had right L2-3, L3-4 hemilaminectomy, medial facetectomy, and foraminotomy in March 2022 by Dr. Yetta Barre. Pt was then referred  to Dr. Terrace Arabia who ordered a cervical MRI and referred her to PT. Pt reports that she rolls in computer chair some at home. Pt reports partcipating in Silver Sneakers exercise program x3 per week and a community walking program x2 per week.    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    Currently in Pain? Yes    Pain Score --   up to 8/10 when up walking. 0/10 at rest   Pain Location Back    Pain Orientation Right    Pain Descriptors / Indicators Numbness    Pain Type Chronic pain    Pain Radiating Towards down leg to foot    Pain Onset More than a month ago    Pain Frequency Intermittent    Aggravating Factors  walking increases pain after moving about half a day    Pain Relieving Factors rest                [facility] PT Assessment - 12/19/20 0001  Assessment   Medical Diagnosis right leg paresthesias, neck pain, gait abnormality.    Referring Provider (PT) Dr. Levert Feinstein    Onset Date/Surgical Date 11/28/20      Precautions   Precautions Fall      Balance Screen   Has the patient fallen in the past 6 months Yes    How many times? 3   reports she just fell and doesn't know how in kitchen and 2 falls in kitchen.   Has the patient had a decrease in activity level because of a fear of falling?  No    Is the patient reluctant to leave their home because of a fear of falling?  No      Home Environment   Living Environment Private residence    Living Arrangements Other relatives   staying at son's house currently   Available Help at Discharge Family    Type of Home House    Home Access Stairs to enter    Entrance Stairs-Number of Steps 2     Entrance Stairs-Rails None    Home Layout Two level   pt on first floor and son in basement   Additional Comments husband passed away in 2022/04/03 so she moved in with son.      Prior Function   Level of Independence Independent   prior to 2019   Vocation Retired    Leisure help with grandchildren, go out with family, reading. Does silver sneakers 3 x/week and also does walk and talk program 2x/week.      Cognition   Overall Cognitive Status Within Functional Limits for tasks assessed      Observation/Other Assessments   Observations negative hoffmans bilateral      Sensation   Light Touch Impaired by gross assessment    Additional Comments numbness C6,7,8 on right hand. Numbness right leg all except L5 and S1      ROM / Strength   AROM / PROM / Strength Strength      Strength   Strength Assessment Site Shoulder;Elbow;Hand;Hip;Knee;Ankle    Right/Left Shoulder Right;Left    Right Shoulder Flexion 3+/5   history of rotator cuff surgery   Left Shoulder Flexion 4-/5   pain with resistance   Right/Left Elbow Right;Left    Right Elbow Flexion 5/5    Right Elbow Extension 4+/5    Left Elbow Flexion 5/5    Left Elbow Extension 4+/5    Right/Left Hip Right;Left    Right Hip Flexion 3+/5    Right Hip ABduction 4+/5    Right Hip ADduction 5/5    Left Hip Flexion 3+/5   pain left hip   Left Hip ABduction 4+/5    Left Hip ADduction 5/5    Right/Left Knee Right;Left    Right Knee Flexion 3+/5    Right Knee Extension 4+/5    Left Knee Flexion 4/5    Left Knee Extension 5/5    Right/Left Ankle Right;Left    Right Ankle Dorsiflexion 5/5    Left Ankle Dorsiflexion 5/5      Transfers   Transfers Sit to Stand;Stand to Sit    Sit to Stand 5: Supervision;With upper extremity assist    Five time sit to stand comments  20.42 sec with hands from chair    Stand to Sit 5: Supervision;Without upper extremity assist      Ambulation/Gait   Ambulation/Gait Yes    Ambulation/Gait  Assistance 5: Supervision    Ambulation Distance (Feet) 75 Feet  Assistive device None    Gait Pattern Step-through pattern;Decreased step length - right;Decreased step length - left;Decreased arm swing - right;Decreased arm swing - left;Antalgic;Decreased trunk rotation    Ambulation Surface Level;Indoor    Gait velocity 12.08 sec=0.1m/s    Stairs Yes    Stairs Assistance 5: Supervision    Stairs Assistance Details (indicate cue type and reason) forward with ascent with 1 rail then sideways with descent without rail. Steps up with RLE and down with LLE    Stair Management Technique One rail Right;Step to pattern;Forwards;Sideways    Number of Stairs 4    Height of Stairs 6      Standardized Balance Assessment   Standardized Balance Assessment Berg Balance Test;Timed Up and Go Test      Berg Balance Test   Sit to Stand Able to stand  independently using hands    Standing Unsupported Able to stand safely 2 minutes    Sitting with Back Unsupported but Feet Supported on Floor or Stool Able to sit safely and securely 2 minutes    Stand to Sit Sits safely with minimal use of hands    Transfers Able to transfer safely, definite need of hands    Standing Unsupported with Eyes Closed Able to stand 10 seconds safely    Standing Unsupported with Feet Together Able to place feet together independently and stand 1 minute safely    From Standing, Reach Forward with Outstretched Arm Can reach forward >5 cm safely (2")    From Standing Position, Pick up Object from Floor Able to pick up shoe safely and easily    From Standing Position, Turn to Look Behind Over each Shoulder Looks behind from both sides and weight shifts well    Turn 360 Degrees Able to turn 360 degrees safely but slowly    Standing Unsupported, Alternately Place Feet on Step/Stool Able to stand independently and safely and complete 8 steps in 20 seconds    Standing Unsupported, One Foot in Front Able to plae foot ahead of the other  independently and hold 30 seconds    Standing on One Leg Tries to lift leg/unable to hold 3 seconds but remains standing independently    Total Score 46      Timed Up and Go Test   TUG Normal TUG    Normal TUG (seconds) 13.12      High Level Balance   High Level Balance Comments MCTSIB 3/4 with losing balance at 15 sec on compliant surface for 105 sec                        Objective measurements completed on examination: See above findings.                PT Education - 12/19/20 1150     Education Details PT plan of care.    Person(s) Educated Patient    Methods Explanation    Comprehension Verbalized understanding              PT Short Term Goals - 12/19/20 1140       PT SHORT TERM GOAL #1   Title Pt will be educated and independent with HEP to facilitate progress in LE strength and balance.    Time 4    Period Weeks    Status New    Target Date 01/16/21      PT SHORT TERM GOAL #2   Title Pt will be able to  ascend/descend 3 steps safely without the use of handrails to allow safe entry and exit from home.    Time 4    Period Weeks    Status New    Target Date 01/16/21      PT SHORT TERM GOAL #3   Title Pt will be able to walk at 1.0 m/s w/ 30m walk test demonstrating improved community ambulation.    Baseline .82 m/s    Time 4    Period Weeks    Status New    Target Date 01/16/21               PT Long Term Goals - 12/19/20 1141       PT LONG TERM GOAL #1   Title Pt will be able to perfom 3 sit to stands without UE assist demonstrating improved LE strength for facilitated transfers and functional mobility. (LTGs due 02/14/21)    Time 8    Period Weeks    Status New    Target Date 02/14/21      PT LONG TERM GOAL #2   Title Pt will be able to perform MCTSIB 4/4 without losing balance for a total of 120 seconds on noncomplaint surface demonstrating an improvement in balance to decrease fall risk.    Baseline 12/19/20 3/4  with 105 sec total with last condition affected    Time 8    Period Weeks    Status New    Target Date 02/14/21      PT LONG TERM GOAL #3   Title Pt will be able to peform 5x sit to stand in </= 15 seconds demonstrating an improvment in balance to decresae falls risk and functional mobility.    Baseline 12/19/20 20.42 sec with hands from chair    Time 8    Period Weeks    Status New    Target Date 02/14/21      PT LONG TERM GOAL #4   Title Pt will be able to walk >500' without AD independently on varied surfaces for improved community mobility.    Time 8    Period Weeks    Status New    Target Date 02/14/21                    Plan - 12/19/20 1120     Clinical Impression Statement Pt presents to therapy status post right L2-3, L3-4 hemilaminectomy, medial facetectomy, and foraminotomy in March 2022.  PMH includes: HTN, DM, vertigo, hx of right lumbar radiculopathy. Pt MRI of cervical spine showed multilevel degenerative changes, moderate to severe spinal stenosis at C6 C7-T1, moderate stenosis at C 6 7, C5-6, myelomalacia at C5-6 level. Pt presents with decreased strength BLE and sensation to light touch in RLE possibly contributing to pt's balance problems. Pt walks at .82 m/s demonstrating a limited community ambulator. Pt 5x sit to stand required UE assist with a time of 20.42 seconds and a TUG of 13.12 seconds denotes this pt as a falls risk. Pt's scored a 46/56 on Berg. Pt would benefit from skilled PT to address the pt's strength and balance deficits to facilitate an increase in higher quality ADLs and functional mobility.    Personal Factors and Comorbidities Comorbidity 3+    Comorbidities HTN, DM, vertigo, hx of right lumbar radiculopathy    Examination-Activity Limitations Bend;Stairs;Squat;Locomotion Level;Transfers;Stand;Caring for Others    Examination-Participation Restrictions Meal Prep;Cleaning;Yard Work;Community Activity    Stability/Clinical Decision Making  Evolving/Moderate complexity  Clinical Decision Making Moderate    Rehab Potential Good    PT Frequency 2x / week   plus eval   PT Duration 8 weeks    PT Treatment/Interventions ADLs/Self Care Home Management;Gait training;Stair training;Functional mobility training;Therapeutic activities;Therapeutic exercise;Balance training;Neuromuscular re-education;Patient/family education;Vestibular;Manual techniques;DME Instruction;Passive range of motion    PT Next Visit Plan PT plans to provide and educate pt on HEP for balance and strengthening with more focus on hip, perform balance, stairs, and gait exercises    PT Home Exercise Plan Will be provided next visit    Consulted and Agree with Plan of Care Patient             Patient will benefit from skilled therapeutic intervention in order to improve the following deficits and impairments:  Abnormal gait, Decreased activity tolerance, Decreased balance, Impaired sensation, Decreased strength, Decreased mobility  Visit Diagnosis: Difficulty in walking, not elsewhere classified  Muscle weakness (generalized)  Other abnormalities of gait and mobility  Unsteadiness on feet     Problem List Patient Active Problem List   Diagnosis Date Noted   Right leg paresthesias 11/28/2020   Neck pain 11/28/2020   Gait abnormality 11/28/2020   S/P lumbar laminectomy 05/01/2020   Neurogenic claudication due to lumbar spinal stenosis 04/02/2020    Ronn Melena, PT, DPT, NCS 12/19/2020, 12:03 PM  [facility] [address] [place], [place], [address] Phone: [phone]   Fax:  [phone]  Name: Tonya Chaney MRN: [id number] Date of Birth: September 15, 1945

## 2020-12-31 ENCOUNTER — Ambulatory Visit: Payer: Medicare Other

## 2021-01-02 ENCOUNTER — Other Ambulatory Visit: Payer: Self-pay

## 2021-01-02 ENCOUNTER — Ambulatory Visit: Payer: Medicare Other | Attending: Neurology

## 2021-01-02 DIAGNOSIS — R262 Difficulty in walking, not elsewhere classified: Secondary | ICD-10-CM | POA: Insufficient documentation

## 2021-01-02 DIAGNOSIS — R2681 Unsteadiness on feet: Secondary | ICD-10-CM | POA: Diagnosis present

## 2021-01-02 DIAGNOSIS — R2689 Other abnormalities of gait and mobility: Secondary | ICD-10-CM | POA: Diagnosis not present

## 2021-01-02 DIAGNOSIS — R42 Dizziness and giddiness: Secondary | ICD-10-CM | POA: Diagnosis present

## 2021-01-02 DIAGNOSIS — M6281 Muscle weakness (generalized): Secondary | ICD-10-CM | POA: Insufficient documentation

## 2021-01-02 NOTE — Patient Instructions (Signed)
Access Code: [email or web address] URL: [email or web address] Date: 01/02/2021 Prepared by: Elmer Bales  Exercises Standing on pillow eyes open and eyes closed - 2 x daily - 5 x weekly - 1 sets - 2 reps - 30 sec hold Standing on pillow with head nods - 2 x daily - 5 x weekly - 2 sets - 10 reps Wide Stance with Head Rotation on Foam Pad - 2 x daily - 5 x weekly - 2 sets - 10 reps Standing Hip Flexion with Resistance Loop - 1 x daily - 5 x weekly - 3 sets - 10 reps Standing Hip Abduction Kicks - 1 x daily - 5 x weekly - 3 sets - 10 reps

## 2021-01-02 NOTE — Therapy (Signed)
[facility] [address] [place], [place], [address] Phone: [phone]   Fax:  [phone]  Physical Therapy Treatment  Patient Details  Name: Tonya Chaney MRN: [id number] Date of Birth: 01-Jan-1946 Referring Provider (PT): Dr. Levert Feinstein   Encounter Date: 01/02/2021   PT End of Session - 01/02/21 0845     Visit Number 2    Number of Visits 17    Date for PT Re-Evaluation 02/14/21    Authorization Type UHC medicare so 10th visit progress note    PT Start Time 0845    PT Stop Time 0927    PT Time Calculation (min) 42 min    Equipment Utilized During Treatment Gait belt    Activity Tolerance Patient tolerated treatment well    Behavior During Therapy [facility] for tasks assessed/performed             Past Medical History:  Diagnosis Date   Allergies    Arthritis    Diabetes mellitus without complication (HCC)    GERD (gastroesophageal reflux disease)    Hypertension    Vertigo    04/30/20- not in a couple of years    Past Surgical History:  Procedure Laterality Date   ABDOMINAL HYSTERECTOMY     CHOLECYSTECTOMY     COLONOSCOPY     LUMBAR LAMINECTOMY/DECOMPRESSION MICRODISCECTOMY Bilateral 05/01/2020   Procedure: Laminectomy and Foraminotomy - Lumbar Two-three, Lumbar Three-Four bilateral;  Surgeon: Tia Alert, MD;  Location: [facility] OR;  Service: Neurosurgery;  Laterality: Bilateral;  posterior   ROTATOR CUFF REPAIR Right     There were no vitals filed for this visit.   Subjective Assessment - 01/02/21 0847     Subjective Pt states she missed [place] on Tuesday due to having pink eye. Pt reports that other than this she is feeling good. Pt reports on fall on Saturday, she states she got up and just fell. She doesn't know how she fell but laid there for a while then got up. Pt reports she got up and was not hurt.    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    Currently in Pain? No/denies    Pain Onset More than a month ago                               [facility] Adult PT Treatment/Exercise - 01/02/21 0850       Transfers   Transfers Sit to Stand;Stand to Sit    Sit to Stand With upper extremity assist;5: Supervision    Stand to Sit 5: Supervision;With upper extremity assist      Ambulation/Gait   Ambulation/Gait Yes    Ambulation/Gait Assistance 5: Supervision    Ambulation Distance (Feet) 345 Feet    Assistive device None    Gait Pattern Step-through pattern;Decreased step length - right;Decreased step length - left;Antalgic    Ambulation Surface Level;Indoor      Neuro Re-ed    Neuro Re-ed Details  In corner w/ chair in front pt performed standing on airex pad x30' each w/ EO/EC w/  feet shoulder width apart. Pt demonstrated increased sway w/ eyes closed. Pt then performed head turns while standing on foam x10 each horizontal/vertical. Pt lost balance once but caught herself w/ the chair. PT provided min guard and provided verbal cues to mainatin tight glutes while swaying.      Exercises   Exercises Other Exercises    Other Exercises  At // 2x10 standing hip flexion/hip abduction w/ red band. PT cued pt to stand up straight and to go slow and controled. In // bars pt performed step ups onto 6 inch step x8 reps BLE. Pt demonstrated more difficulty when stepping up w/ LLE and required single UE assist. Pt then performed side steps in // onto 6' step. Pt used UE assist on bars. PT provided min guard and verbal cues for technique.                     PT Education - 01/02/21 1325     Education Details PT provided and  educated pt on HEP w/ focus on static balance activities and hip strengthening exercises.    Person(s) Educated Patient    Methods Explanation;Demonstration;Handout    Comprehension Verbalized understanding              PT Short Term Goals - 12/19/20 1140       PT SHORT TERM GOAL #1   Title Pt will be educated and independent with HEP to facilitate progress in LE strength and balance.    Time 4    Period Weeks    Status New    Target Date 01/16/21      PT SHORT TERM GOAL #2   Title Pt will be able to ascend/descend 3 steps safely without the use of handrails to allow safe entry and exit from home.    Time 4    Period Weeks    Status New    Target Date 01/16/21      PT SHORT TERM GOAL #3   Title Pt will be able to walk at 1.0 m/s w/ 33m walk test demonstrating improved community ambulation.    Baseline .82 m/s    Time 4    Period Weeks    Status New    Target Date 01/16/21               PT Long Term Goals - 12/19/20 1141       PT LONG TERM GOAL #1   Title Pt will be able to perfom 3 sit to stands without UE assist demonstrating improved LE strength for facilitated transfers and functional mobility. (LTGs due 02/14/21)    Time 8    Period Weeks    Status New    Target Date 02/14/21      PT LONG TERM GOAL #2   Title Pt will be able to perform MCTSIB 4/4 without losing balance for a total of 120 seconds on noncomplaint surface demonstrating an improvement in balance to decrease fall risk.    Baseline 12/19/20 3/4 with 105 sec total with last condition affected    Time 8    Period Weeks    Status New    Target Date 02/14/21      PT LONG TERM GOAL #3   Title Pt will be able to peform 5x sit to stand in </= 15 seconds demonstrating an improvment in balance to decresae falls risk and functional mobility.    Baseline 12/19/20 20.42 sec with hands from chair  Time 8    Period Weeks    Status New    Target Date 02/14/21      PT LONG TERM GOAL #4   Title Pt  will be able to walk >500' without AD independently on varied surfaces for improved community mobility.    Time 8    Period Weeks    Status New    Target Date 02/14/21                   Plan - 01/02/21 1326     Clinical Impression Statement Today's therapy session focused on providing pt an intial HEP focused on balance and bilat hip strengthening activities. Pt presents with the ability to maintain static balance with a wide base of support on a compliant surface with minimal guarding. Pt still presents with deficits in balance w/ EC and in LLE strength. Pt tolerated today's session well w/ no adverse s/s and appropriate fatigue at the conclusion of therapy. PT will continue to progress pt as tolerated w/ activities to faciliate LTG achievement.    Personal Factors and Comorbidities Comorbidity 3+    Comorbidities HTN, DM, vertigo, hx of right lumbar radiculopathy    Examination-Activity Limitations Bend;Stairs;Squat;Locomotion Level;Transfers;Stand;Caring for Others    Examination-Participation Restrictions Meal Prep;Cleaning;Yard Work;Community Activity    Stability/Clinical Decision Making Evolving/Moderate complexity    Rehab Potential Good    PT Frequency 2x / week   plus eval   PT Duration 8 weeks    PT Treatment/Interventions ADLs/Self Care Home Management;Gait training;Stair training;Functional mobility training;Therapeutic activities;Therapeutic exercise;Balance training;Neuromuscular re-education;Patient/family education;Vestibular;Manual techniques;DME Instruction;Passive range of motion    PT Next Visit Plan Continue to progress pt with static balance and dynamic balance activities    PT Home Exercise Plan Will be provided next visit    Consulted and Agree with Plan of Care Patient             Patient will benefit from skilled therapeutic intervention in order to improve the following deficits and impairments:  Abnormal gait, Decreased activity tolerance, Decreased  balance, Impaired sensation, Decreased strength, Decreased mobility  Visit Diagnosis: Other abnormalities of gait and mobility  Unsteadiness on feet  Muscle weakness (generalized)  Difficulty in walking, not elsewhere classified     Problem List Patient Active Problem List   Diagnosis Date Noted   Right leg paresthesias 11/28/2020   Neck pain 11/28/2020   Gait abnormality 11/28/2020   S/P lumbar laminectomy 05/01/2020   Neurogenic claudication due to lumbar spinal stenosis 04/02/2020    Margie Ege, Student-PT 01/02/2021, 1:37 PM  [place] [address] [place], [place], [address] Phone: [phone]   Fax:  [phone]  Name: Faren Florence MRN: [id number] Date of Birth: 1945-05-08

## 2021-01-08 ENCOUNTER — Ambulatory Visit: Payer: Medicare Other

## 2021-01-10 ENCOUNTER — Ambulatory Visit: Payer: Medicare Other

## 2021-01-10 ENCOUNTER — Other Ambulatory Visit: Payer: Self-pay

## 2021-01-10 DIAGNOSIS — M6281 Muscle weakness (generalized): Secondary | ICD-10-CM

## 2021-01-10 DIAGNOSIS — R2681 Unsteadiness on feet: Secondary | ICD-10-CM

## 2021-01-10 DIAGNOSIS — R262 Difficulty in walking, not elsewhere classified: Secondary | ICD-10-CM

## 2021-01-10 DIAGNOSIS — R42 Dizziness and giddiness: Secondary | ICD-10-CM

## 2021-01-10 DIAGNOSIS — R2689 Other abnormalities of gait and mobility: Secondary | ICD-10-CM | POA: Diagnosis not present

## 2021-01-10 NOTE — Therapy (Addendum)
[facility] [address] [place], [place], [address] Phone: [phone]   Fax:  [phone]  Physical Therapy Treatment  Patient Details  Name: Tonya Chaney MRN: [id number] Date of Birth: 03/14/1945 Referring Provider (PT): Dr. Levert Feinstein   Encounter Date: 01/10/2021   PT End of Session - 01/10/21 0942     Visit Number 3    Number of Visits 17    Date for PT Re-Evaluation 02/14/21    Authorization Type [employer] medicare so 10th visit progress note    PT Start Time 0846    PT Stop Time 0930    PT Time Calculation (min) 44 min    Equipment Utilized During Treatment Gait belt    Activity Tolerance Patient tolerated treatment well    Behavior During Therapy [facility] for tasks assessed/performed             Past Medical History:  Diagnosis Date   Allergies    Arthritis    Diabetes mellitus without complication (HCC)    GERD (gastroesophageal reflux disease)    Hypertension    Vertigo    04/30/20- not in a couple of years    Past Surgical History:  Procedure Laterality Date   ABDOMINAL HYSTERECTOMY     CHOLECYSTECTOMY     COLONOSCOPY     LUMBAR LAMINECTOMY/DECOMPRESSION MICRODISCECTOMY Bilateral 05/01/2020   Procedure: Laminectomy and Foraminotomy - Lumbar Two-three, Lumbar Three-Four bilateral;  Surgeon: Tia Alert, MD;  Location: [facility] OR;  Service: Neurosurgery;  Laterality: Bilateral;  posterior   ROTATOR CUFF REPAIR Right     There were no vitals filed for this visit.   Subjective Assessment - 01/10/21 0851     Subjective Pt reports having a rough couple of days w/ increased pain in L hip. Pt states she has been doing exercises everyday including days where she goes to silver sneakers which she says may be causing this flare up.    Pertinent History HTN, DM, vertigo, hx of right lumbar radiculopathy    Diagnostic tests MRI of cervical spine showed multilevel degenerative changes, moderate to severe spinal  stenosis at C6 C7-T1, moderate stenosis at C 6 7, C5-6, myelomalacia at C5-6 level    Patient Stated Goals Pt wants to do everything she can to get stronger and steadier to be able to help with her grandchildren.    Currently in Pain? Yes    Pain Score 8     Pain Location Hip    Pain Orientation Left;Posterior    Pain Type Chronic pain    Pain Onset More than a month ago                               [facility] Adult PT Treatment/Exercise - 01/10/21 0854       Transfers   Transfers Sit to Stand;Stand to Sit    Sit to Stand With upper extremity assist;5: Supervision    Stand to Sit 5: Supervision      Ambulation/Gait   Ambulation/Gait Yes    Ambulation/Gait Assistance 5: Supervision    Ambulation Distance (Feet) 230 Feet    Assistive device None    Gait Pattern Step-through pattern;Decreased step length - right;Decreased step length - left;Antalgic    Ambulation Surface Level;Indoor      Neuro Re-ed    Neuro Re-ed Details  In corner w/ chair in front pt performed standing on airex pad 2x30' each w/  EO/EC w/ feet shoulder width apart. Pt demonstrated increased sway w/ eyes closed. Pt then performed head turns while standing on foam 2x10 each horizontal/vertical. Pt lost balance once but caught herself w/ the chair while performing horizontal head turns. PT provided min guard and provided verbal cues to mainatin tight glutes while swaying. In // patient performed 3 laps of reciprocal stepping over hurdles. Pt required slight UE assist to maintain balance and had more difficulty w/ standing on LLE. PT provided verbal cues for technique and min guard. Pt perfomed 3 laps of side steps over hurdles in //. Pt required UE assist and had more difficulty w/ stepping towards the R. PT provided verbal cues for technique and min guard.      Exercises   Exercises Other Exercises    Other Exercises  At // 2x8 standing hip abduction w/o band and 1x8 w/ yellow band. PT cued pt to stand  up straight and to go slow and controlled. Pt performed 3x8 of of sidelying clamshells of LLE on mat. PT provided verbal cues to tell pt to perform exercise in pain free ROM.      Manual Therapy   Manual Therapy Soft tissue mobilization    Soft tissue mobilization PT perfomed soft tissue mobilization to L glute in sidelying to try and reduce pt's report of pain. PT found one trigger point and was able to get it out per pt report of feeling it relieve. Pt stated pain dropped from and 8/10 to 5/10 post manual therapy.                     PT Education - 01/10/21 0948     Education Details PT educated pt on performing activities within pain free ROM and to regress/progress HEP to not aggravate s/s.    Person(s) Educated Patient    Methods Explanation    Comprehension Verbalized understanding              PT Short Term Goals - 12/19/20 1140       PT SHORT TERM GOAL #1   Title Pt will be educated and independent with HEP to facilitate progress in LE strength and balance.    Time 4    Period Weeks    Status New    Target Date 01/16/21      PT SHORT TERM GOAL #2   Title Pt will be able to ascend/descend 3 steps safely without the use of handrails to allow safe entry and exit from home.    Time 4    Period Weeks    Status New    Target Date 01/16/21      PT SHORT TERM GOAL #3   Title Pt will be able to walk at 1.0 m/s w/ 75m walk test demonstrating improved community ambulation.    Baseline .82 m/s    Time 4    Period Weeks    Status New    Target Date 01/16/21               PT Long Term Goals - 12/19/20 1141       PT LONG TERM GOAL #1   Title Pt will be able to perfom 3 sit to stands without UE assist demonstrating improved LE strength for facilitated transfers and functional mobility. (LTGs due 02/14/21)    Time 8    Period Weeks    Status New    Target Date 02/14/21      PT LONG  TERM GOAL #2   Title Pt will be able to perform MCTSIB 4/4 without  losing balance for a total of 120 seconds on noncomplaint surface demonstrating an improvement in balance to decrease fall risk.    Baseline 12/19/20 3/4 with 105 sec total with last condition affected    Time 8    Period Weeks    Status New    Target Date 02/14/21      PT LONG TERM GOAL #3   Title Pt will be able to peform 5x sit to stand in </= 15 seconds demonstrating an improvment in balance to decresae falls risk and functional mobility.    Baseline 12/19/20 20.42 sec with hands from chair    Time 8    Period Weeks    Status New    Target Date 02/14/21      PT LONG TERM GOAL #4   Title Pt will be able to walk >500' without AD independently on varied surfaces for improved community mobility.    Time 8    Period Weeks    Status New    Target Date 02/14/21                   Plan - 01/10/21 0944     Clinical Impression Statement Pt presents to the clinic w/ self-reports of increased pain in L hip. PT performed manual therpay and was able to reduce pain and was able to lower pain from 8/10 to 5/10 per self-report by pt. Pt was able to complete a full session focused on BLE strengthening and balance activities. Pt demonstrates deficits in balance specifically when incorporating head turns and when isolating LLE. PT will continue to progress pt as tolerated w/ activities towards pt achievement of STG/LTG. Pt also reported some dizziness/vertigo with bed mobility and reports she has been told crystals may be involved in past but never treated other than meclizine.    Personal Factors and Comorbidities Comorbidity 3+    Comorbidities HTN, DM, vertigo, hx of right lumbar radiculopathy    Examination-Activity Limitations Bend;Stairs;Squat;Locomotion Level;Transfers;Stand;Caring for Others    Examination-Participation Restrictions Meal Prep;Cleaning;Yard Work;Community Activity    Stability/Clinical Decision Making Evolving/Moderate complexity    Rehab Potential Good    PT  Frequency 2x / week   plus eval   PT Duration 8 weeks    PT Treatment/Interventions ADLs/Self Care Home Management;Gait training;Stair training;Functional mobility training;Therapeutic activities;Therapeutic exercise;Balance training;Neuromuscular re-education;Patient/family education;Vestibular;Manual techniques;DME Instruction;Passive range of motion;Canalith Repostioning    PT Next Visit Plan Ask about L glute pain and follow up on vertigo referral getting on Edmonia James or Rosalita Chessman for a couple visits. Primary PT sent updated cert to Dr. Terrace Arabia to add this. Continue to progress pt with static balance and dynamic balance activities    PT Home Exercise Plan Will be provided next visit    Consulted and Agree with Plan of Care Patient             Patient will benefit from skilled therapeutic intervention in order to improve the following deficits and impairments:  Abnormal gait, Decreased activity tolerance, Decreased balance, Impaired sensation, Decreased strength, Decreased mobility  Visit Diagnosis: Other abnormalities of gait and mobility  Unsteadiness on feet  Muscle weakness (generalized)  Difficulty in walking, not elsewhere classified  Dizziness and giddiness     Problem List Patient Active Problem List   Diagnosis Date Noted   Right leg paresthesias 11/28/2020   Neck pain 11/28/2020   Gait abnormality 11/28/2020  S/P lumbar laminectomy 05/01/2020   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

## 2021-01-14 ENCOUNTER — Other Ambulatory Visit: Payer: Self-pay

## 2021-01-14 ENCOUNTER — Ambulatory Visit: Payer: Medicare Other

## 2021-01-21 ENCOUNTER — Ambulatory Visit: Payer: Medicare Other

## 2021-01-23 ENCOUNTER — Ambulatory Visit: Payer: Medicare Other | Attending: Neurology

## 2021-01-23 DIAGNOSIS — R2681 Unsteadiness on feet: Secondary | ICD-10-CM | POA: Insufficient documentation

## 2021-01-23 DIAGNOSIS — R2689 Other abnormalities of gait and mobility: Secondary | ICD-10-CM | POA: Insufficient documentation

## 2021-01-23 DIAGNOSIS — M6281 Muscle weakness (generalized): Secondary | ICD-10-CM | POA: Insufficient documentation

## 2021-01-23 DIAGNOSIS — R42 Dizziness and giddiness: Secondary | ICD-10-CM | POA: Insufficient documentation

## 2021-01-23 DIAGNOSIS — R262 Difficulty in walking, not elsewhere classified: Secondary | ICD-10-CM | POA: Insufficient documentation

## 2021-01-29 ENCOUNTER — Ambulatory Visit: Payer: Medicare Other

## 2021-01-29 ENCOUNTER — Other Ambulatory Visit: Payer: Self-pay

## 2021-01-29 DIAGNOSIS — R262 Difficulty in walking, not elsewhere classified: Secondary | ICD-10-CM

## 2021-01-29 DIAGNOSIS — R42 Dizziness and giddiness: Secondary | ICD-10-CM | POA: Diagnosis present

## 2021-01-29 DIAGNOSIS — R2681 Unsteadiness on feet: Secondary | ICD-10-CM

## 2021-01-29 DIAGNOSIS — M6281 Muscle weakness (generalized): Secondary | ICD-10-CM | POA: Diagnosis present

## 2021-01-29 DIAGNOSIS — R2689 Other abnormalities of gait and mobility: Secondary | ICD-10-CM

## 2021-01-29 NOTE — Therapy (Signed)
[facility] [address] [place], [place], [address] Phone: [phone]   Fax:  [phone]  Physical Therapy Treatment  Patient Details  Name: Tonya Chaney MRN: [id number] Date of Birth: 11-05-1945 Referring Provider (PT): Dr. Marcial Pacas   Encounter Date: 01/29/2021   PT End of Session - 01/29/21 0846     Visit Number 4    Number of Visits 17    Date for PT Re-Evaluation 02/14/21    Authorization Type [employer]HC medicare so 10th visit progress note    PT Start Time 0847    PT Stop Time 0928    PT Time Calculation (min) 41 min    Equipment Utilized During Treatment Gait belt    Activity Tolerance Patient tolerated treatment well    Behavior During Therapy [facility] for tasks assessed/performed             Past Medical History:  Diagnosis Date   Allergies    Arthritis    Diabetes mellitus without complication (HCC)    GERD (gastroesophageal reflux disease)    Hypertension    Vertigo    04/30/20- not in a couple of years    Past Surgical History:  Procedure Laterality Date   ABDOMINAL HYSTERECTOMY     CHOLECYSTECTOMY     COLONOSCOPY     LUMBAR LAMINECTOMY/DECOMPRESSION MICRODISCECTOMY Bilateral 05/01/2020   Procedure: Laminectomy and Foraminotomy - Lumbar Two-three, Lumbar Three-Four bilateral;  Surgeon: Eustace Moore, MD;  Location: [place];  Service: Neurosurgery;  Laterality: Bilateral;  posterior   ROTATOR CUFF REPAIR Right     There were no vitals filed for this visit.   Subjective Assessment - 01/29/21 0847     Subjective Patient reports that the legs have been bothering her, reports they feel heavy. Reports wound therapy recommended wraps for the legs, but she is going to think about this.    Pertinent History HTN, DM, vertigo, hx of right lumbar radiculopathy    Diagnostic tests MRI of cervical spine showed multilevel degenerative changes, moderate to severe spinal stenosis at C6 C7-T1, moderate stenosis at  C 6 7, C5-6, myelomalacia at C5-6 level    Patient Stated Goals Pt wants to do everything she can to get stronger and steadier to be able to help with her grandchildren.    Currently in Pain? Yes    Pain Score 8     Pain Location Leg    Pain Orientation Left    Pain Descriptors / Indicators Heaviness    Pain Type Chronic pain    Pain Radiating Towards down into lower leg    Pain Onset More than a month ago    Pain Frequency Intermittent                [facility] Adult PT Treatment/Exercise - 01/29/21 0001       Transfers   Transfers Sit to Stand;Stand to Sit    Sit to Stand With upper extremity assist;5: Supervision    Stand to Sit 5: Supervision      Ambulation/Gait   Ambulation/Gait Yes    Ambulation/Gait Assistance 5: Supervision    Ambulation/Gait Assistance Details throughout therapy gym with activity    Ambulation Distance (Feet) --   clinic distance   Assistive device None    Gait Pattern Step-through pattern;Decreased step length - right;Decreased step length - left;Antalgic    Ambulation Surface Level;Indoor    Gait velocity 11.66 secs = 0.86 m/s    Stairs Yes  Stairs Assistance 5: Supervision    Stairs Assistance Details (indicate cue type and reason) completed without UE support step to pattern, needing to turn laterally to come down without rails    Stair Management Technique No rails;Step to pattern;Forwards    Number of Stairs 4    Height of Stairs 6      Neuro Re-ed    Neuro Re-ed Details  at countertop: completed lateral side stepping over hurdles working on upright posture and improved step length, completed x 3 laps down and back. intermittent UE support required      Exercises   Exercises Knee/Hip      Knee/Hip Exercises: Standing   Forward Step Up Both;1 set;10 reps;Hand Hold: 1;Step Height: 6";Limitations    Forward Step Up Limitations at stairs with single UE support stepping up with RLE due to weakness, cues for up tall posture with completion.              Completed entire review of HEP during session:   Access Code: [email or web address] URL: [email or web address] Date: 01/29/2021 Prepared by: Baldomero Lamy  Exercises Standing on pillow eyes open and eyes closed - 2 x daily - 5 x weekly - 1 sets - 2 reps - 30 sec hold Standing on pillow with head nods - 2 x daily - 5 x weekly - 2 sets - 10 reps Wide Stance with Head Rotation on Foam Pad - 2 x daily - 5 x weekly - 2 sets - 10 reps All balance progressed to more narrow BOS (1-2 inches between feet)   Standing Hip Flexion with Resistance Loop - 1 x daily - 5 x weekly - 3 sets - 10 reps Standing Hip Abduction Kicks - 1 x daily - 5 x weekly - 3 sets - 10 reps No progression of theraband today, continue use of yellow band.    PT Education - 01/29/21 0929     Education Details progress toward STGs; HEP Review    Person(s) Educated Patient    Methods Explanation;Demonstration    Comprehension Verbalized understanding;Returned demonstration              PT Short Term Goals - 01/29/21 0901       PT SHORT TERM GOAL #1   Title Pt will be educated and independent with HEP to facilitate progress in LE strength and balance.    Baseline reports independnece with current HEP; will continue to progress    Time 4    Period Weeks    Status Achieved    Target Date 01/16/21      PT SHORT TERM GOAL #2   Title Pt will be able to ascend/descend 3 steps safely without the use of handrails to allow safe entry and exit from home.    Baseline imbalance noted without use of rails, CGA    Time 4    Period Weeks    Status Not Met    Target Date 01/16/21      PT SHORT TERM GOAL #3   Title Pt will be able to walk at 1.0 m/s w/ 55mwalk test demonstrating improved community ambulation.    Baseline .82 m/s; 0.86 m/s    Time 4    Period Weeks    Status Not Met    Target Date 01/16/21               PT Long Term Goals - 12/19/20 1141       PT LONG TERM GOAL #1  Title  Pt will be able to perfom 3 sit to stands without UE assist demonstrating improved LE strength for facilitated transfers and functional mobility. (LTGs due 02/14/21)    Time 8    Period Weeks    Status New    Target Date 02/14/21      PT LONG TERM GOAL #2   Title Pt will be able to perform MCTSIB 4/4 without losing balance for a total of 120 seconds on noncomplaint surface demonstrating an improvement in balance to decrease fall risk.    Baseline 12/19/20 3/4 with 105 sec total with last condition affected    Time 8    Period Weeks    Status New    Target Date 02/14/21      PT LONG TERM GOAL #3   Title Pt will be able to peform 5x sit to stand in </= 15 seconds demonstrating an improvment in balance to decresae falls risk and functional mobility.    Baseline 12/19/20 20.42 sec with hands from chair    Time 8    Period Weeks    Status New    Target Date 02/14/21      PT LONG TERM GOAL #4   Title Pt will be able to walk >500' without AD independently on varied surfaces for improved community mobility.    Time 8    Period Weeks    Status New    Target Date 02/14/21                   Plan - 01/29/21 0930     Clinical Impression Statement Completed assesment of patient's progress toward STGs. Patient able to meet STG #1, and demonstrating progress toward STG #2 and #3 but unable to meet to goal level at this time. Rest of session reviewed HEP and continue to progress balance/BLE strenghtening exercises to patients tolerance.    Personal Factors and Comorbidities Comorbidity 3+    Comorbidities HTN, DM, vertigo, hx of right lumbar radiculopathy    Examination-Activity Limitations Bend;Stairs;Squat;Locomotion Level;Transfers;Stand;Caring for Others    Examination-Participation Restrictions Meal Prep;Cleaning;Yard Work;Community Activity    Stability/Clinical Decision Making Evolving/Moderate complexity    Rehab Potential Good    PT Frequency 2x / week   plus eval   PT  Duration 8 weeks    PT Treatment/Interventions ADLs/Self Care Home Management;Gait training;Stair training;Functional mobility training;Therapeutic activities;Therapeutic exercise;Balance training;Neuromuscular re-education;Patient/family education;Vestibular;Manual techniques;DME Instruction;Passive range of motion;Canalith Repostioning    PT Next Visit Plan Ask about L glute pain. Pt did not wanted vertigo assessed as reports has felt well, assess if this changes. Primary PT sent updated cert to Dr. Krista Blue to add this. Continue to progress pt with static balance and dynamic balance activities    PT Home Exercise Plan Will be provided next visit    Consulted and Agree with Plan of Care Patient             Patient will benefit from skilled therapeutic intervention in order to improve the following deficits and impairments:  Abnormal gait, Decreased activity tolerance, Decreased balance, Impaired sensation, Decreased strength, Decreased mobility  Visit Diagnosis: Other abnormalities of gait and mobility  Unsteadiness on feet  Muscle weakness (generalized)  Difficulty in walking, not elsewhere classified  Dizziness and giddiness     Problem List Patient Active Problem List   Diagnosis Date Noted   Right leg paresthesias 11/28/2020   Neck pain 11/28/2020   Gait abnormality 11/28/2020   S/P lumbar laminectomy 05/01/2020  Neurogenic claudication due to lumbar spinal stenosis 04/02/2020    Jones Bales, PT, DPT 01/29/2021, 10:26 AM  [facility] [address] [place], [place], [address] Phone: [phone]   Fax:  [phone]  Name: Tonya Chaney MRN: [id number] Date of Birth: 06-Jun-1945

## 2021-01-29 NOTE — Patient Instructions (Signed)
Access Code: [email or web address] URL: [email or web address] Date: 01/29/2021 Prepared by: Jethro Bastos  Exercises Standing on pillow eyes open and eyes closed - 2 x daily - 5 x weekly - 1 sets - 2 reps - 30 sec hold Standing on pillow with head nods - 2 x daily - 5 x weekly - 2 sets - 10 reps Wide Stance with Head Rotation on Foam Pad - 2 x daily - 5 x weekly - 2 sets - 10 reps Standing Hip Flexion with Resistance Loop - 1 x daily - 5 x weekly - 3 sets - 10 reps Standing Hip Abduction Kicks - 1 x daily - 5 x weekly - 3 sets - 10 reps

## 2021-01-31 ENCOUNTER — Ambulatory Visit: Payer: Medicare Other

## 2021-01-31 ENCOUNTER — Other Ambulatory Visit: Payer: Self-pay

## 2021-01-31 DIAGNOSIS — M6281 Muscle weakness (generalized): Secondary | ICD-10-CM

## 2021-01-31 DIAGNOSIS — R2689 Other abnormalities of gait and mobility: Secondary | ICD-10-CM | POA: Diagnosis not present

## 2021-01-31 DIAGNOSIS — R2681 Unsteadiness on feet: Secondary | ICD-10-CM

## 2021-01-31 NOTE — Therapy (Signed)
[facility] [address] [place], [place], [address] Phone: [phone]   Fax:  [phone]  Physical Therapy Treatment  Patient Details  Name: Tonya Chaney MRN: [id number] Date of Birth: 04/12/1945 Referring Provider (PT): Dr. Marcial Pacas   Encounter Date: 01/31/2021   PT End of Session - 01/31/21 1109     Visit Number 5    Number of Visits 17    Date for PT Re-Evaluation 02/14/21    Authorization Type [employer] medicare so 10th visit progress note    PT Start Time 0845    PT Stop Time 0928    PT Time Calculation (min) 43 min    Equipment Utilized During Treatment Gait belt    Activity Tolerance Patient tolerated treatment well    Behavior During Therapy [facility] for tasks assessed/performed             Past Medical History:  Diagnosis Date   Allergies    Arthritis    Diabetes mellitus without complication (HCC)    GERD (gastroesophageal reflux disease)    Hypertension    Vertigo    04/30/20- not in a couple of years    Past Surgical History:  Procedure Laterality Date   ABDOMINAL HYSTERECTOMY     CHOLECYSTECTOMY     COLONOSCOPY     LUMBAR LAMINECTOMY/DECOMPRESSION MICRODISCECTOMY Bilateral 05/01/2020   Procedure: Laminectomy and Foraminotomy - Lumbar Two-three, Lumbar Three-Four bilateral;  Surgeon: Eustace Moore, MD;  Location: [facility];  Service: Neurosurgery;  Laterality: Bilateral;  posterior   ROTATOR CUFF REPAIR Right     There were no vitals filed for this visit.   Subjective Assessment - 01/31/21 0845     Subjective Pt reports she is feeling good and that the exercises she has been given have helped. Pt reports she is moving a lot better. Pt reports he pain in her glute has gotten better.    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    Currently in Pain? No/denies    Pain Onset More than a month ago                               [facility] Adult PT Treatment/Exercise - 01/31/21 0847       Transfers   Transfers Sit to Stand;Stand to Sit    Sit to Stand 5: Supervision;With upper extremity assist    Stand to Sit 5: Supervision      Neuro Re-ed    Neuro Re-ed Details  In //: Pt perfromed standing on airex w/ Head turns EO/EC vertical/horizontal 2x10 each. Pt demonstrated increased sway w/ head turns horizontally w/ EO vs EC. Pt needed occasional UE assist to catch balance. PT provided min guard and verbal cues for technique. Pt performed 2 laps of cone taps and marching over 4 cones. Pt had increased difficutly w/ supporting herself on RLE vs LLE. Pt did not require UE assist but did stumble occasionally but she was able to self-correct. PT provided verbal cues for technique and min guard. Pt performed 2 laps of side steps  on blue balance beam. Pt had increased difficulty w/ side stepping leading w/ RLE. PT verbally cued pt to take smaller steps to maintain balance. PT provided min guard.      Exercises   Exercises Other Exercises    Other Exercises  In //: Pt perfromed 3 laps of side steps w/ yellow band at ankles. PT verbally cued pt to stay upright and to control movements. Pt states she felt it more in her RLE. Pt perfromed one set of step ups on 4in step w/ airex on top x10 BLE each w/ UE support. PT provided verbal cues for technique. Pt performed another set of step ups on 4in step w/ airex on top x10 BLE each w/o UE support. Pt had increased difficulty stepping up w/ RLE and had one loss of balance she was able to correct w/ use of bar. PT provided min guard and verbal cues for technique.                        PT Short Term Goals - 01/29/21 0901       PT SHORT TERM GOAL #1   Title Pt will be educated and independent with HEP to facilitate progress in LE strength and balance.    Baseline reports independnece with current HEP; will continue to progress    Time 4    Period Weeks    Status Achieved    Target Date 01/16/21      PT SHORT TERM GOAL #2   Title Pt will be able to ascend/descend 3 steps safely without the use of handrails to allow safe entry and exit from home.    Baseline imbalance noted without use of rails, CGA    Time 4    Period Weeks    Status Not Met    Target Date 01/16/21      PT SHORT TERM GOAL #3   Title Pt will be able to walk at 1.0 m/s w/ 44mwalk test demonstrating improved community ambulation.    Baseline .82 m/s; 0.86 m/s    Time 4    Period Weeks    Status Not Met    Target Date 01/16/21               PT Long Term Goals - 12/19/20 1141       PT LONG TERM GOAL #1   Title Pt will be able to perfom 3 sit to stands without UE assist demonstrating improved LE strength for facilitated transfers and functional mobility. (LTGs due 02/14/21)    Time 8    Period Weeks    Status New    Target Date 02/14/21      PT LONG TERM GOAL #2   Title Pt will be able to perform MCTSIB 4/4 without losing balance for a total of 120 seconds on noncomplaint surface demonstrating an improvement in balance to decrease fall risk.    Baseline 12/19/20 3/4 with 105 sec total with last condition affected    Time 8    Period Weeks    Status New    Target Date 02/14/21      PT LONG TERM GOAL #3   Title Pt will be able to peform 5x sit to stand in </= 15 seconds demonstrating an improvment in balance to decresae falls risk and functional mobility.    Baseline 12/19/20 20.42 sec with hands from chair    Time 8    Period Weeks  Status New    Target Date 02/14/21      PT LONG TERM GOAL #4   Title Pt will be able to walk >500' without AD independently on varied surfaces for  improved community mobility.    Time 8    Period Weeks    Status New    Target Date 02/14/21                   Plan - 01/31/21 1111     Clinical Impression Statement Today's PT session focused on continued improvements on BLE strength and balance. Pt still has difficulty w/ balance on compliant surfaces or when having to support herself on RLE. Pt continues to demonstrate difficulties w/ balance activities w/ HT especially w/ EO vs EC.  PT will continue to progress pt w/ POC towards pt achievement of LTG.    Personal Factors and Comorbidities Comorbidity 3+    Comorbidities HTN, DM, vertigo, hx of right lumbar radiculopathy    Examination-Activity Limitations Bend;Stairs;Squat;Locomotion Level;Transfers;Stand;Caring for Others    Examination-Participation Restrictions Meal Prep;Cleaning;Yard Work;Community Activity    Stability/Clinical Decision Making Evolving/Moderate complexity    Rehab Potential Good    PT Frequency 2x / week   plus eval   PT Duration 8 weeks    PT Treatment/Interventions ADLs/Self Care Home Management;Gait training;Stair training;Functional mobility training;Therapeutic activities;Therapeutic exercise;Balance training;Neuromuscular re-education;Patient/family education;Vestibular;Manual techniques;DME Instruction;Passive range of motion;Canalith Repostioning    PT Next Visit Plan Ask about L glute pain.  Continue to progress pt with static balance and dynamic balance activities    PT Home Exercise Plan Will be provided next visit    Consulted and Agree with Plan of Care Patient             Patient will benefit from skilled therapeutic intervention in order to improve the following deficits and impairments:  Abnormal gait, Decreased activity tolerance, Decreased balance, Impaired sensation, Decreased strength, Decreased mobility  Visit Diagnosis: Unsteadiness on feet  Muscle weakness (generalized)  Other abnormalities of gait and  mobility     Problem List Patient Active Problem List   Diagnosis Date Noted   Right leg paresthesias 11/28/2020   Neck pain 11/28/2020   Gait abnormality 11/28/2020   S/P lumbar laminectomy 05/01/2020   Neurogenic claudication due to lumbar spinal stenosis 04/02/2020    Lottie Mussel, Student-PT 01/31/2021, 2:11 PM  [facility] [address] [place], [place], [address] Phone: [phone]   Fax:  [phone]  Name: Tonya Chaney MRN: [id number] Date of Birth: Jan 10, 1946

## 2021-02-04 ENCOUNTER — Other Ambulatory Visit: Payer: Self-pay

## 2021-02-04 ENCOUNTER — Ambulatory Visit: Payer: Medicare Other

## 2021-02-04 DIAGNOSIS — M6281 Muscle weakness (generalized): Secondary | ICD-10-CM

## 2021-02-04 DIAGNOSIS — R2681 Unsteadiness on feet: Secondary | ICD-10-CM

## 2021-02-04 DIAGNOSIS — R2689 Other abnormalities of gait and mobility: Secondary | ICD-10-CM

## 2021-02-04 NOTE — Therapy (Signed)
[facility] [address] [place], [place], [address] Phone: [phone]   Fax:  [phone]  Physical Therapy Treatment  Patient Details  Name: Tonya Chaney MRN: [id number] Date of Birth: 1945/06/06 Referring Provider (PT): Dr. Marcial Pacas   Encounter Date: 02/04/2021   PT End of Session - 02/04/21 1026     Visit Number 6    Number of Visits 17    Date for PT Re-Evaluation 02/14/21    Authorization Type [employer] medicare so 10th visit progress note    PT Start Time 0932    PT Stop Time 1013    PT Time Calculation (min) 41 min    Equipment Utilized During Treatment Gait belt    Activity Tolerance Patient tolerated treatment well    Behavior During Therapy [facility] for tasks assessed/performed             Past Medical History:  Diagnosis Date   Allergies    Arthritis    Diabetes mellitus without complication ([place])    GERD (gastroesophageal reflux disease)    Hypertension    Vertigo    04/30/20- not in a couple of years    Past Surgical History:  Procedure Laterality Date   ABDOMINAL HYSTERECTOMY     CHOLECYSTECTOMY     COLONOSCOPY     LUMBAR LAMINECTOMY/DECOMPRESSION MICRODISCECTOMY Bilateral 05/01/2020   Procedure: Laminectomy and Foraminotomy - Lumbar Two-three, Lumbar Three-Four bilateral;  Surgeon: Eustace Moore, MD;  Location: [facility];  Service: Neurosurgery;  Laterality: Bilateral;  posterior   ROTATOR CUFF REPAIR Right     There were no vitals filed for this visit.   Subjective Assessment - 02/04/21 0933     Subjective Pt reports she has been good since last appointment. Pt states her glute is better. Pt reports doing her HEP and no issues w/ them.    Pertinent History HTN, DM, vertigo, hx of right lumbar radiculopathy    Diagnostic tests MRI of cervical spine showed multilevel degenerative changes, moderate to severe spinal stenosis at C6 C7-T1, moderate stenosis at C 6 7, C5-6, myelomalacia at C5-6  level    Patient Stated Goals Pt wants to do everything she can to get stronger and steadier to be able to help with her grandchildren.    Currently in Pain? No/denies    Pain Onset More than a month ago                               [facility] Adult PT Treatment/Exercise - 02/04/21 0934       Transfers   Transfers Sit to Stand;Stand to Sit    Sit to Stand 5: Supervision;With upper extremity assist    Stand to Sit 5: Supervision      Ambulation/Gait   Ambulation/Gait Yes    Ambulation/Gait Assistance 5: Supervision    Ambulation/Gait Assistance Details throughout session in between activities    Assistive device None    Gait Pattern Step-through pattern;Decreased step length - right;Decreased step length - left;Antalgic    Ambulation Surface Level;Indoor      Neuro Re-ed    Neuro Re-ed Details  In // pt performed: 2x10 of head turns w/ EC/EO vertical/horizontal each while standing on airex. Pt demonstrated increased sway w/ EO and vertical head turns. Pt had one loss of balance but was able to self-correct using UE assist. PT provided min guard and verbal cues for technique. 2x30" of rockerboard  anterior/posterior position w/ EO/EC  each. Pt demonstrated increased sway w/ EC and required intermittent UE assist to start and diminished to no UE assist. PT provided min guard. 2 laps of 4 cone taps and marches over cone one blue mat. Pt required intermittent UE assist especially when having to rely on RLE for balance. PT provided min guard and verbal cues for technique. Next to // pt performed 3 laps of stepping stones. Pt required intermittent UE assist to catch balance on occasion. Pt had increased difficutly w/ relying on RLE for balance. PT provided min guard. Pt progressed to stepping stones w/ inclusion of 3 dyna discs. Pt had increased difficulty w/ this and required more UE assist and PT CGA while stepping on compliant discs.      Exercises   Exercises Other Exercises     Other Exercises  In //: Pt performed 3 laps of side steps w/ yellow bands around ankles. Pt had increased difficulty w/ controlling band while stepping w/ RLE. PT cued pt to controll band and stay with toes pointed forward. pt did not require UE asssit. Pt performed 3x10 BLE marching w/ yellow band. Pt required UE for upright posture. PT cued pt to bring knee higher and to control band down.                       PT Short Term Goals - 01/29/21 0901       PT SHORT TERM GOAL #1   Title Pt will be educated and independent with HEP to facilitate progress in LE strength and balance.    Baseline reports independnece with current HEP; will continue to progress    Time 4    Period Weeks    Status Achieved    Target Date 01/16/21      PT SHORT TERM GOAL #2   Title Pt will be able to ascend/descend 3 steps safely without the use of handrails to allow safe entry and exit from home.    Baseline imbalance noted without use of rails, CGA    Time 4    Period Weeks    Status Not Met    Target Date 01/16/21      PT SHORT TERM GOAL #3   Title Pt will be able to walk at 1.0 m/s w/ 65mwalk test demonstrating improved community ambulation.    Baseline .82 m/s; 0.86 m/s    Time 4    Period Weeks    Status Not Met    Target Date 01/16/21               PT Long Term Goals - 12/19/20 1141       PT LONG TERM GOAL #1   Title Pt will be able to perfom 3 sit to stands without UE assist demonstrating improved LE strength for facilitated transfers and functional mobility. (LTGs due 02/14/21)    Time 8    Period Weeks    Status New    Target Date 02/14/21      PT LONG TERM GOAL #2   Title Pt will be able to perform MCTSIB 4/4 without losing balance for a total of 120 seconds on noncomplaint surface demonstrating an improvement in balance to decrease fall risk.    Baseline 12/19/20 3/4 with 105 sec total with last condition affected    Time 8    Period Weeks    Status New     Target Date 02/14/21  PT LONG TERM GOAL #3   Title Pt will be able to peform 5x sit to stand in </= 15 seconds demonstrating an improvment in balance to decresae falls risk and functional mobility.    Baseline 12/19/20 20.42 sec with hands from chair    Time 8    Period Weeks    Status New    Target Date 02/14/21      PT LONG TERM GOAL #4   Title Pt will be able to walk >500' without AD independently on varied surfaces for improved community mobility.    Time 8    Period Weeks    Status New    Target Date 02/14/21                   Plan - 02/04/21 1028     Clinical Impression Statement Today's PT session focused on continued BLE strengthening and balance activities. Pt still presents w/ deficits especially when having to rely on RLE for balance and while ambulating on compliant surfaces. PT will continue to progress pt w/ POC towards pt achievement of LTG.    Personal Factors and Comorbidities Comorbidity 3+    Comorbidities HTN, DM, vertigo, hx of right lumbar radiculopathy    Examination-Activity Limitations Bend;Stairs;Squat;Locomotion Level;Transfers;Stand;Caring for Others    Examination-Participation Restrictions Meal Prep;Cleaning;Yard Work;Community Activity    Stability/Clinical Decision Making Evolving/Moderate complexity    Rehab Potential Good    PT Frequency 2x / week   plus eval   PT Duration 8 weeks    PT Treatment/Interventions ADLs/Self Care Home Management;Gait training;Stair training;Functional mobility training;Therapeutic activities;Therapeutic exercise;Balance training;Neuromuscular re-education;Patient/family education;Vestibular;Manual techniques;DME Instruction;Passive range of motion;Canalith Repostioning    PT Next Visit Plan Ask about L glute pain.  Continue to progress pt with static balance and dynamic balance activities    PT Home Exercise Plan Will be provided next visit    Consulted and Agree with Plan of Care Patient              Patient will benefit from skilled therapeutic intervention in order to improve the following deficits and impairments:  Abnormal gait, Decreased activity tolerance, Decreased balance, Impaired sensation, Decreased strength, Decreased mobility  Visit Diagnosis: Unsteadiness on feet  Muscle weakness (generalized)  Other abnormalities of gait and mobility     Problem List Patient Active Problem List   Diagnosis Date Noted   Right leg paresthesias 11/28/2020   Neck pain 11/28/2020   Gait abnormality 11/28/2020   S/P lumbar laminectomy 05/01/2020   Neurogenic claudication due to lumbar spinal stenosis 04/02/2020    Lottie Mussel, Student-PT 02/04/2021, 10:31 AM  [facility] [address] [place], [place], [address] Phone: [phone]   Fax:  [phone]  Name: Tonya Chaney MRN: [id number] Date of Birth: 12-15-1945

## 2021-02-05 ENCOUNTER — Ambulatory Visit (INDEPENDENT_AMBULATORY_CARE_PROVIDER_SITE_OTHER): Payer: Medicare Other | Admitting: Neurology

## 2021-02-05 DIAGNOSIS — G6289 Other specified polyneuropathies: Secondary | ICD-10-CM | POA: Diagnosis not present

## 2021-02-05 DIAGNOSIS — R202 Paresthesia of skin: Secondary | ICD-10-CM

## 2021-02-05 DIAGNOSIS — M5412 Radiculopathy, cervical region: Secondary | ICD-10-CM

## 2021-02-05 DIAGNOSIS — G629 Polyneuropathy, unspecified: Secondary | ICD-10-CM | POA: Insufficient documentation

## 2021-02-05 DIAGNOSIS — R269 Unspecified abnormalities of gait and mobility: Secondary | ICD-10-CM

## 2021-02-05 DIAGNOSIS — M542 Cervicalgia: Secondary | ICD-10-CM | POA: Diagnosis not present

## 2021-02-05 DIAGNOSIS — M5416 Radiculopathy, lumbar region: Secondary | ICD-10-CM

## 2021-02-05 NOTE — Procedures (Signed)
Full Name: Tonya Chaney Gender: Female MRN #: [id number] Date of Birth: [birth year]    Visit Date: 02/05/2021 08:45 Age: 75 Years Examining Physician: Levert Feinstein, MD  Referring Physician: Levert Feinstein, MD Height: 5 feet 5 inch Patient History: 79lbs History: 75 year old female, with history of poorly controlled diabetes, lumbar decompression surgery, follow-up chronic low back pain, right lumbar radiculopathy, presenting with persistent lower extremity paresthesia, intermittent upper extremity paresthesia, gait abnormality post lumbar decompression surgery.  Summary of the test: Nerve conduction study: Bilateral sural, left superficial peroneal, right ulnar sensory responses were absent.  Right radial sensory response was within normal limit.  Bilateral tibial, left peroneal to EDB motor responses were absent.  Right ulnar motor response showed significantly decreased CMAP amplitude, low normal range conduction velocity.  Electromyography: Selected needle examinations of bilateral lower extremity muscles, upper extremity muscles, lumbar paraspinal and cervical paraspinal muscles were performed  There was evidence of mild active neuropathic changes involving the right L4-5 more than S1 dermatomes, clearly asymmetry compared to left lower extremity muscles.  There is increased insertional activity at bilateral lower lumbar paraspinal muscles, along the well-healed surgical scar.    There was also evidence of mild chronic neuropathic changes involving bilateral T1, C8, C7 more than C6 myotomes.  There was increased insertional activity at bilateral cervical paraspinal muscles, complex motor unit potential.  Conclusion: This is an abnormal study.  There is electrodiagnostic evidence of mild chronic right lumbar radiculopathy, involving right L4-5 more than S1 myotomes, in a background of length dependent severe axonal sensorimotor polyneuropathy.  In addition, there is evidence of  chronic bilateral cervical radiculopathy, involving T1, C8, C7 more than C6 myotomes.    ------------------------------- Levert Feinstein, M.D. Ph.D.  [facility] [address] [place], [place] [address] Tel: [phone] Fax: [phone]  Verbal informed consent was obtained from the patient, patient was informed of potential risk of procedure, including bruising, bleeding, hematoma formation, infection, muscle weakness, muscle pain, numbness, among others.        MNC    Nerve / Sites Muscle Latency Ref. Amplitude Ref. Rel Amp Segments Distance Velocity Ref. Area    ms ms mV mV %  cm m/s m/s mVms  R Ulnar - ADM     Wrist ADM 3.3 ?3.3 1.8 ?6.0 100 Wrist - ADM 7   5.8     B.Elbow ADM 7.4  1.7  91.1 B.Elbow - Wrist 20 49 ?49 6.2     A.Elbow ADM 9.5  1.9  113 A.Elbow - B.Elbow 10 49 ?49 10.4  L Peroneal - EDB     Ankle EDB NR ?6.5 NR ?2.0 NR Ankle - EDB 9   NR     Fib head EDB NR  NR  NR Fib head - Ankle 27 NR ?44 NR     Pop fossa EDB NR  NR  NR Pop fossa - Fib head 10 NR ?44 NR         Pop fossa - Ankle      L Tibial - AH     Ankle AH NR ?5.8 NR ?4.0 NR Ankle - AH 9   NR     Pop fossa AH NR  NR  NR Pop fossa - Ankle 39 NR ?41 NR  R Tibial - AH     Ankle AH NR ?5.8 NR ?4.0 NR Ankle - AH 9   NR     Pop fossa AH NR  NR  NR Pop fossa - Ankle 39 NR ?41 NR             SNC    Nerve / Sites Rec. Site Peak Lat Ref.  Amp Ref. Segments Distance    ms ms V V  cm  R Radial - Anatomical snuff box (Forearm)     Forearm Wrist 2.7 ?2.9 16 ?15 Forearm - Wrist 10  L Sural - Ankle (Calf)     Calf Ankle NR ?4.4 NR ?6 Calf - Ankle 10  R Sural - Ankle (Calf)     Calf Ankle NR ?4.4 NR ?6 Calf - Ankle 14  L Superficial peroneal - Ankle     Lat leg Ankle NR ?4.4 NR ?6 Lat leg - Ankle 10  R Ulnar - Orthodromic, (Dig V, Mid palm)     Dig V Wrist NR ?3.1 NR ?5 Dig V - Wrist 53               F  Wave    Nerve F Lat Ref.   ms ms  L Tibial - AH NR ?56.0  R Ulnar - ADM 30.3  ?32.0         EMG Summary Table    Spontaneous MUAP Recruitment  Muscle IA Fib PSW Fasc Other Amp Dur. Poly Pattern  L. Tibialis anterior Increased None None None _______ Normal Normal Normal Reduced  L. Tibialis posterior Increased None None None _______ Normal Normal Normal Reduced  L. Peroneus longus Increased None None None _______ Normal Normal Normal Reduced  L. Gastrocnemius (Medial head) Normal None None None _______ Normal Normal Normal Reduced  L. Vastus lateralis Normal None None None _______ Normal Normal Normal Reduced  R. Tibialis anterior Increased 2+ None None _______ Increased Increased 1+ Reduced  R. Tibialis posterior Increased None None None _______ Increased Increased 1+ Reduced  R. Peroneus longus Increased None None None _______ Increased Increased 1+ Reduced  R. Gastrocnemius (Medial head) Increased None None None _______ Normal Normal Normal Reduced  R. Vastus lateralis Normal None None None _______ Normal Normal Normal Reduced  R. Lumbar paraspinals (mid) Increased None None None _______ Normal Normal Normal Normal  R. Lumbar paraspinals (low) Increased None None None _______ Normal Normal Normal Normal  L. Lumbar paraspinals (low) Increased None None None _______ Normal Normal Normal Normal  L. Lumbar paraspinals (mid) Increased None None None _______ Normal Normal Normal Normal  R. First dorsal interosseous Increased 2+ 2+ None _______ Increased Increased 1+ Reduced  R. Pronator teres Increased None None None _______ Normal Normal Normal Reduced  R. Flexor carpi ulnaris Normal None None None _______ Normal Normal Normal Reduced  R. Brachioradialis Normal None None None _______ Normal Normal Normal Reduced  R. Biceps brachii Normal None None None _______ Normal Normal Normal Normal  R. Deltoid Normal None None None _______ Normal Normal Normal Normal  R. Triceps brachii Increased None None None _______ Normal Normal Normal Reduced  R. Extensor digitorum communis  Increased None None None _______ Normal Normal Normal Reduced  L. First dorsal interosseous Increased None None None _______ Normal Normal Normal Reduced  L. Pronator teres Normal None None None _______ Normal Normal Normal Reduced  L. Biceps brachii Normal None None None _______ Normal Normal Normal Normal  L. Deltoid Normal None None None _______ Normal Normal Normal Normal  L. Triceps brachii Normal None None None _______ Normal Normal Normal Reduced  L. Brachioradialis Normal None None None _______ Normal Normal Normal Reduced  L.  Extensor digitorum communis Normal None None None _______ Normal Normal Normal Reduced  R. Cervical paraspinals Increased None None None _______ Normal Increased 1+ Normal  L. Cervical paraspinals Increased None None None _______ Normal Increased 1+ Normal

## 2021-02-05 NOTE — Progress Notes (Signed)
No chief complaint on file.     ASSESSMENT AND PLAN  Tonya Chaney is a 75 y.o. 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 at C7-T1, C5-6, C6-7 level with severe bilateral foraminal stenosis, there was no cord signal abnormality,  Patient denies significant neck pain, occasionally urinary urgency, frequency, still function, cannot ambulate without a cane, she does not want to proceed with any surgical prevention at this point, less likely she is optimal surgical candidate due to multilevel stenosis  Peripheral neuropathy  Length dependent, severe, axonal sensorimotor polyneuropathy, most consistent with her poorly controlled diabetes, previous A1c 11.5,  Laboratory evaluation repeat A1c, rule out other possible causes of peripheral neuropathy  Status post right L2-3, L3-4 hemilaminectomy, medial facetectomy, and foraminotomy in March 2022,  Continued right lower extremity paresthesia, occasionally radiating pain,  EMG nerve conduction study today confirmed residual right L4-5 more than S1 chronic right lumbar radiculopathy,  Gait abnormality  Multifactorial, hammertoes, flatfeet, peripheral neuropathy, cervical spondylitic myelopathy, overweight, aging, deconditioning,  I have suggested her using cane as needed, continue moderate exercise, she would benefit most from optimal control of her diabetes.   DIAGNOSTIC DATA (LABS, IMAGING, TESTING) - I reviewed patient records, labs, notes, testing and imaging myself where available.   MEDICAL HISTORY:  Tonya Chaney is a 75 year old female, seen in request by her primary care physician Dr. Andrey Campanile, Ranae Pila, for evaluation of bilateral feet paresthesia, initial evaluation was on November 28, 2020   I reviewed and summarized the referring note. 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 Marikay Alar  She reported significant improvement of her low back pain, radiating pain to  right leg, but continues to have persistent right leg numbness radiating from right lateral thigh into right lateral leg, to top of right foot, she felt right foot swelling,  Also complains of urinary urgency, occasional incontinence, continue have gait abnormality,  She also complains of neck pain, radiating pain to bilateral shoulder  UPDATE Feb 05 2021 Patient returns for evaluation of her worsening gait abnormality, lower extremity paresthesia, intermittent hands paresthesia  EMG nerve conduction study today which showed evidence of severe length dependent axonal sensorimotor polyneuropathy, also evidence of active neuropathic change involving right L4-5 more than S1 myotomes, consistent with her previous history of right lumbar radiculopathy  We also personally reviewed MRI of cervical spine in October 2022, multilevel degenerative changes, moderate to severe spinal stenosis at C7-T1, C6-7, C5-6, with severe bilateral foraminal stenosis, C4-5, mild spinal stenosis, with severe bilateral foraminal stenosis,   PHYSICAL EXAM: There is no height or weight on file to calculate BMI.  PHYSICAL EXAMNIATION:  Gen: NAD, conversant, well nourised, well groomed                     Cardiovascular: Regular rate  rhythm, no peripheral edema, warm, nontender. Eyes: Conjunctivae clear without  exudates or hemorrhage Neck: Supple, no carotid bruits. Pulmonary: Clear to auscultation bilaterally   NEUROLOGICAL EXAM:  MENTAL STATUS: Speech/cognition: Awake, alert, oriented to history taking and casual conversation   CRANIAL NERVES: CN II: Visual fields are full to confrontation. Pupils are round equal and briskly reactive to light. CN III, IV, VI: extraocular movement are normal. No ptosis. CN V: Facial sensation is intact to light touch CN VII: Face is symmetric with normal eye closure  CN VIII: Hearing is normal to causal conversation. CN IX, X: Phonation is normal. CN XI: Head turning and shoulder shrug are intact  MOTOR: Limited examination of proximal left upper extremity due to left shoulder pain, right hand intrinsic muscle atrophy, moderate finger abduction weakness,  Mild bilateral lower extremity proximal muscle weakness, with mild right toe extension weakness.  REFLEXES: Reflexes are 2+ and symmetric at the biceps, triceps, 3/3 knees, and absent at ankle, bilateral plantar responses were flexor,  SENSORY: Length dependent decreased to light touch, pinprick and vibratory sensation at ankles  COORDINATION: There is no trunk or limb dysmetria noted.  GAIT/STANCE: She needs push-up to get up from seated position, flatfeet, wide-based, stiff, unsteady  REVIEW OF SYSTEMS:  Full 14 system review of systems performed and notable only for as above All other review of systems were negative.   ALLERGIES: Allergies  Allergen Reactions   Oxycodone Other (See Comments)    lethargey   Tramadol Anxiety   Tuna Flavor [Flavoring Agent] Diarrhea and Nausea And Vomiting   Penicillins Hives, Swelling and Rash    Reaction: patient was 75 years old    HOME MEDICATIONS: Current Outpatient Medications  Medication Sig Dispense Refill   albuterol (VENTOLIN HFA) 108 (90 Base) MCG/ACT inhaler  Inhale 2 puffs into the lungs every 6 (six) hours as needed for wheezing or shortness of breath.     aspirin 81 MG chewable tablet Chew 81 mg by mouth daily.     azelastine (ASTELIN) 0.1 % nasal spray Place 2 sprays into both nostrils daily.     cholecalciferol (VITAMIN D) 25 MCG (1000 UNIT) tablet Take 1,000 Units by mouth daily.     DEXILANT 60 MG capsule Take 1 capsule by mouth daily.     diclofenac Sodium (VOLTAREN) 1 % GEL Apply 2 g topically 4 (four) times daily. (Patient not taking: Reported on 12/19/2020) 2 g 5   FEROSUL 325 (65 Fe) MG tablet Take 325 mg by mouth at bedtime.     fluticasone (FLONASE) 50 MCG/ACT nasal spray Place 2 sprays into both nostrils at bedtime.     HUMALOG MIX 75/25 KWIKPEN (75-25) 100 UNIT/ML Kwikpen Inject 20-50 Units into the skin See admin instructions. Take 50 units in the morning 20 units at noon and 30 units at bedtime     losartan (COZAAR) 50 MG tablet Take 75 mg by mouth daily.     meclizine (ANTIVERT) 25 MG tablet Take 25 mg by mouth 3 (three) times daily as needed for dizziness.     metFORMIN (GLUCOPHAGE-XR) 750 MG 24 hr tablet Take 750 mg by mouth 2 (two) times daily.     methocarbamol (ROBAXIN) 500 MG tablet Take 1 tablet (500 mg total) by mouth every 6 (six) hours as needed for muscle spasms. (Patient not taking: Reported on 12/19/2020) 45 tablet 0   montelukast (SINGULAIR) 10 MG tablet Take 10 mg by mouth at bedtime.     rosuvastatin (CRESTOR) 5 MG tablet Take 5 mg by mouth daily.  SYMBICORT 160-4.5 MCG/ACT inhaler Inhale 2 puffs into the lungs 2 (two) times daily.     vitamin B-12 (CYANOCOBALAMIN) 1000 MCG tablet Take 1,000 mcg by mouth 2 (two) times daily.     No current facility-administered medications for this visit.    PAST MEDICAL HISTORY: Past Medical History:  Diagnosis Date   Allergies    Arthritis    Diabetes mellitus without complication (HCC)    GERD (gastroesophageal reflux disease)    Hypertension    Vertigo    04/30/20- not  in a couple of years    PAST SURGICAL HISTORY: Past Surgical History:  Procedure Laterality Date   ABDOMINAL HYSTERECTOMY     CHOLECYSTECTOMY     COLONOSCOPY     LUMBAR LAMINECTOMY/DECOMPRESSION MICRODISCECTOMY Bilateral 05/01/2020   Procedure: Laminectomy and Foraminotomy - Lumbar Two-three, Lumbar Three-Four bilateral;  Surgeon: Tia Alert, MD;  Location: [facility];  Service: Neurosurgery;  Laterality: Bilateral;  posterior   ROTATOR CUFF REPAIR Right     FAMILY HISTORY: No family history on file.  SOCIAL HISTORY: Social History   Socioeconomic History   Marital status: Legally Separated    Spouse name: Not on file   Number of children: Not on file   Years of education: Not on file   Highest education level: Not on file  Occupational History   Not on file  Tobacco Use   Smoking status: Never   Smokeless tobacco: Never  Vaping Use   Vaping Use: Never used  Substance and Sexual Activity   Alcohol use: No   Drug use: Not on file   Sexual activity: Not on file  Other Topics Concern   Not on file  Social History Narrative   Lives with 2 great grandchildre   Right Handed   Drinks 1-2 cups caffeine   Social Determinants of Health   Financial Resource Strain: Not on file  Food Insecurity: Not on file  Transportation Needs: Not on file  Physical Activity: Not on file  Stress: Not on file  Social Connections: Not on file  Intimate Partner Violence: Not on file      Levert Feinstein, M.D. Ph.D.  [facility] [address] [place], [place] [address] Ph: [phone] Fax: [phone]  CC:  Yolanda Manges, DO [address] [place],  [place] [address]  Yolanda Manges, DO

## 2021-02-05 NOTE — Patient Instructions (Signed)
° °  Cane with elbow support

## 2021-02-06 ENCOUNTER — Ambulatory Visit: Payer: Medicare Other

## 2021-02-10 ENCOUNTER — Other Ambulatory Visit: Payer: Self-pay

## 2021-02-10 ENCOUNTER — Ambulatory Visit: Payer: Medicare Other

## 2021-02-10 DIAGNOSIS — M6281 Muscle weakness (generalized): Secondary | ICD-10-CM

## 2021-02-10 DIAGNOSIS — R2689 Other abnormalities of gait and mobility: Secondary | ICD-10-CM

## 2021-02-10 DIAGNOSIS — R2681 Unsteadiness on feet: Secondary | ICD-10-CM

## 2021-02-10 NOTE — Therapy (Signed)
[facility] [address] [place], [place], [address] Phone: [phone]   Fax:  [phone]  Physical Therapy Treatment  Patient Details  Name: Tonya Chaney MRN: [id number] Date of Birth: November 07, 1945 Referring Provider (PT): Dr. Marcial Pacas   Encounter Date: 02/10/2021   PT End of Session - 02/10/21 0857     Visit Number 7    Number of Visits 17    Date for PT Re-Evaluation 02/14/21    Authorization Type [employer] medicare so 10th visit progress note    PT Start Time 0855   pt arrived late   PT Stop Time 0929    PT Time Calculation (min) 34 min    Equipment Utilized During Treatment Gait belt    Activity Tolerance Patient tolerated treatment well    Behavior During Therapy [facility] for tasks assessed/performed             Past Medical History:  Diagnosis Date   Allergies    Arthritis    Diabetes mellitus without complication ([place])    GERD (gastroesophageal reflux disease)    Hypertension    Vertigo    04/30/20- not in a couple of years    Past Surgical History:  Procedure Laterality Date   ABDOMINAL HYSTERECTOMY     CHOLECYSTECTOMY     COLONOSCOPY     LUMBAR LAMINECTOMY/DECOMPRESSION MICRODISCECTOMY Bilateral 05/01/2020   Procedure: Laminectomy and Foraminotomy - Lumbar Two-three, Lumbar Three-Four bilateral;  Surgeon: Eustace Moore, MD;  Location: [facility];  Service: Neurosurgery;  Laterality: Bilateral;  posterior   ROTATOR CUFF REPAIR Right     There were no vitals filed for this visit.   Subjective Assessment - 02/10/21 0857     Subjective Pt reports that she had her nerve conduction tests last week.  She had to cancel her visit here after as was sore from needles and rain was also really bad. Pt's study was abnormal and they did discuss possible cervical surgery but pt is not ready for that and wants to think on it further. Pt reports that the only issue in UE is more numbness.    Pertinent History HTN, DM,  vertigo, hx of right lumbar radiculopathy    Diagnostic tests MRI of cervical spine showed multilevel degenerative changes, moderate to severe spinal stenosis at C6 C7-T1, moderate stenosis at C 6 7, C5-6, myelomalacia at C5-6 level    Patient Stated Goals Pt wants to do everything she can to get stronger and steadier to be able to help with her grandchildren.    Currently in Pain? Yes    Pain Location Back    Pain Orientation Mid    Pain Descriptors / Indicators Other (Comment)   deep pain   Pain Type Acute pain    Pain Onset More than a month ago    Pain Frequency Intermittent    Aggravating Factors  no specific time    Pain Relieving Factors rest    Effect of Pain on Daily Activities reports noticed 1 time when went to grandson's school last week and then just now.                               [place] Adult PT Treatment/Exercise - 02/10/21 0902       Transfers   Transfers Sit to Stand;Stand to Sit    Sit to Stand 6: Modified independent (Device/Increase time)    Five time sit  to stand comments  13.34 sec from chair without hands    Stand to Sit 6: Modified independent (Device/Increase time)      Ambulation/Gait   Ambulation/Gait Yes    Ambulation/Gait Assistance 7: Independent    Ambulation Distance (Feet) 600 Feet    Assistive device None    Gait Pattern Step-through pattern;Decreased stance time - right    Ambulation Surface Level;Indoor    Gait velocity 8.94 sec=1.45ms    Stairs Yes    Stairs Assistance 6: Modified independent (Device/Increase time);5: Supervision    Stairs Assistance Details (indicate cue type and reason) 4 steps without rails in step-to pattern with going sideways with descent supervision, 4 steps with bilateral rails reciprocal pattern mod I.    Stair Management Technique No rails;Two rails;Step to pattern;Alternating pattern;Sideways    Number of Stairs 8    Height of Stairs 6      Neuro Re-ed    Neuro Re-ed Details  Corner  balance exercises on pillows: feet apart eyes open x 30 sec, eyes closed x 30 sec then eyes closed with feet together x 30 sec, head turns left/right x 10, slight up/down x 10, staggered stance x 30 sec each position. Supervision with activities.                     PT Education - 02/10/21 1041     Education Details Discussed results of testing and plan to finish next visit. Will plan to recert 1x/week for 4 more weeks then.    Person(s) Educated Patient    Methods Explanation    Comprehension Verbalized understanding              PT Short Term Goals - 02/10/21 0914       PT SHORT TERM GOAL #1   Title Pt will be educated and independent with HEP to facilitate progress in LE strength and balance.    Baseline reports independnece with current HEP; will continue to progress    Time 4    Period Weeks    Status Achieved    Target Date 01/16/21      PT SHORT TERM GOAL #2   Title Pt will be able to ascend/descend 3 steps safely without the use of handrails to allow safe entry and exit from home.    Baseline imbalance noted without use of rails, CGA.  02/10/21 supervision without rails but pt does report 1 rail to hold to now    Time 4    Period Weeks    Status Partially Met    Target Date 01/16/21      PT SHORT TERM GOAL #3   Title Pt will be able to walk at 1.0 m/s w/ 123malk test demonstrating improved community ambulation.    Baseline .82 m/s; 0.86 m/s, 02/10/21 1.8m20m   Time 4    Period Weeks    Status Achieved    Target Date 01/16/21               PT Long Term Goals - 02/10/21 0905       PT LONG TERM GOAL #1   Title Pt will be able to perfom 3 sit to stands without UE assist demonstrating improved LE strength for facilitated transfers and functional mobility. (LTGs due 02/14/21)    Baseline 02/10/21 performed 5 without hands    Time 8    Period Weeks    Status Achieved    Target Date 02/14/21  PT LONG TERM GOAL #2   Title Pt will be able  to perform MCTSIB 4/4 without losing balance for a total of 120 seconds on noncomplaint surface demonstrating an improvement in balance to decrease fall risk.    Baseline 12/19/20 3/4 with 105 sec total with last condition affected    Time 8    Period Weeks    Status New    Target Date 02/14/21      PT LONG TERM GOAL #3   Title Pt will be able to peform 5x sit to stand in </= 15 seconds demonstrating an improvment in balance to decresae falls risk and functional mobility.    Baseline 12/19/20 20.42 sec with hands from chair. 02/10/21 13.34 sec without hands from chair.    Time 8    Period Weeks    Status Achieved    Target Date 02/14/21      PT LONG TERM GOAL #4   Title Pt will be able to walk >500' without AD independently on varied surfaces for improved community mobility.    Baseline 02/10/21 575' without AD on level surfaces independently    Time 8    Period Weeks    Status Partially Met    Target Date 02/14/21                   Plan - 02/10/21 1043     Clinical Impression Statement PT began to assess LTGs. Pt met sit to stand goal, gait speed goal and gait on level surface goal. She is showing significant improvements in functional strength not needing hands to rise from chair anymore. Gait speed is safe for community ambulator. Only assessed gait on level surfaces and will assess nonlevel more next visit.    Personal Factors and Comorbidities Comorbidity 3+    Comorbidities HTN, DM, vertigo, hx of right lumbar radiculopathy    Examination-Activity Limitations Bend;Stairs;Squat;Locomotion Level;Transfers;Stand;Caring for Others    Examination-Participation Restrictions Meal Prep;Cleaning;Yard Work;Community Activity    Stability/Clinical Decision Making Evolving/Moderate complexity    Rehab Potential Good    PT Frequency 2x / week   plus eval   PT Duration 8 weeks    PT Treatment/Interventions ADLs/Self Care Home Management;Gait training;Stair training;Functional  mobility training;Therapeutic activities;Therapeutic exercise;Balance training;Neuromuscular re-education;Patient/family education;Vestibular;Manual techniques;DME Instruction;Passive range of motion;Canalith Repostioning    PT Next Visit Plan Check remaining LTGs. Plan to recert 1x/week for 4 more weeks to continue to focus on her transition to community fitness program.    Consulted and Agree with Plan of Care Patient             Patient will benefit from skilled therapeutic intervention in order to improve the following deficits and impairments:  Abnormal gait, Decreased activity tolerance, Decreased balance, Impaired sensation, Decreased strength, Decreased mobility  Visit Diagnosis: Other abnormalities of gait and mobility  Muscle weakness (generalized)  Unsteadiness on feet     Problem List Patient Active Problem List   Diagnosis Date Noted   Peripheral neuropathy 02/05/2021   Lumbar radiculopathy 02/05/2021   Cervical radiculopathy 02/05/2021   Right leg paresthesias 11/28/2020   Neck pain 11/28/2020   Gait abnormality 11/28/2020   S/P lumbar laminectomy 05/01/2020   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 [place], [place], [address] Phone: [phone]   Fax:  [phone]  Name: Tonya Chaney MRN: [id number] Date of Birth:  1946/01/27

## 2021-02-11 ENCOUNTER — Telehealth: Payer: Self-pay | Admitting: *Deleted

## 2021-02-11 DIAGNOSIS — R202 Paresthesia of skin: Secondary | ICD-10-CM

## 2021-02-11 LAB — MULTIPLE MYELOMA PANEL, SERUM
Albumin SerPl Elph-Mcnc: 3.5 g/dL (ref 2.9–4.4)
Albumin/Glob SerPl: 1.3 (ref 0.7–1.7)
Alpha 1: 0.2 g/dL (ref 0.0–0.4)
Alpha2 Glob SerPl Elph-Mcnc: 0.6 g/dL (ref 0.4–1.0)
B-Globulin SerPl Elph-Mcnc: 1.1 g/dL (ref 0.7–1.3)
Gamma Glob SerPl Elph-Mcnc: 1 g/dL (ref 0.4–1.8)
Globulin, Total: 2.9 g/dL (ref 2.2–3.9)
IgA/Immunoglobulin A, Serum: 406 mg/dL (ref 64–422)
IgG (Immunoglobin G), Serum: 1135 mg/dL (ref 586–1602)
IgM (Immunoglobulin M), Srm: 62 mg/dL (ref 26–217)
Total Protein: 6.4 g/dL (ref 6.0–8.5)

## 2021-02-11 LAB — HGB A1C W/O EAG: Hgb A1c MFr Bld: 9.6 % — ABNORMAL HIGH (ref 4.8–5.6)

## 2021-02-11 LAB — RPR, QUANT+TP ABS (REFLEX)
Rapid Plasma Reagin, Quant: 1:1 {titer} — ABNORMAL HIGH
T Pallidum Abs: REACTIVE — AB

## 2021-02-11 LAB — TSH: TSH: 0.627 u[IU]/mL (ref 0.450–4.500)

## 2021-02-11 LAB — SEDIMENTATION RATE: Sed Rate: 7 mm/hr (ref 0–40)

## 2021-02-11 LAB — VITAMIN B12: Vitamin B-12: 661 pg/mL (ref 232–1245)

## 2021-02-11 LAB — C-REACTIVE PROTEIN: CRP: 4 mg/L (ref 0–10)

## 2021-02-11 LAB — RPR: RPR Ser Ql: REACTIVE — AB

## 2021-02-11 LAB — CK: Total CK: 106 U/L (ref 32–182)

## 2021-02-11 NOTE — Telephone Encounter (Signed)
Left message for a return call

## 2021-02-11 NOTE — Telephone Encounter (Signed)
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 HgbA1c is still signficantly elevated but at least better(keep trying to get it down). Otherwise unremarkable thanks

## 2021-02-12 NOTE — Telephone Encounter (Addendum)
I spoke to the patient who verbalized understanding of the findings. She has been working on getting her A1C down. She is unaware of any past exposure to syphilis. Feels this is likely a false positive. She would like Dr. Terrace Arabia further review and decide if any further testing should be completed.

## 2021-02-13 ENCOUNTER — Ambulatory Visit: Payer: Medicare Other

## 2021-02-18 NOTE — Telephone Encounter (Addendum)
I spoke to the patient. She is agreeable to stop by our office for repeat labs. She is aware we will call her back with the results to discuss a further plan.

## 2021-02-18 NOTE — Telephone Encounter (Signed)
Left message for a return call

## 2021-02-18 NOTE — Telephone Encounter (Signed)
Please call patient, I have entered to repeat RPR T, Pallidum antibody test for her, she can come to our office for repeat laboratory evaluation, if this remains positive, may consider lumbar puncture to rule out central nervous system involvement

## 2021-02-18 NOTE — Addendum Note (Signed)
Addended by: Levert Feinstein on: 02/18/2021 01:24 PM   Modules accepted: Orders

## 2021-02-19 ENCOUNTER — Ambulatory Visit: Payer: Medicare Other

## 2021-02-19 ENCOUNTER — Other Ambulatory Visit (INDEPENDENT_AMBULATORY_CARE_PROVIDER_SITE_OTHER): Payer: Self-pay

## 2021-02-19 DIAGNOSIS — Z0289 Encounter for other administrative examinations: Secondary | ICD-10-CM

## 2021-02-19 DIAGNOSIS — R202 Paresthesia of skin: Secondary | ICD-10-CM

## 2021-02-21 ENCOUNTER — Ambulatory Visit: Payer: Medicare Other

## 2021-02-21 LAB — RPR, QUANT: RPR, Quant: 1:1 {titer} — ABNORMAL HIGH

## 2021-02-21 LAB — RPR W/REFLEX TO TREPSURE: RPR: REACTIVE — AB

## 2021-02-25 ENCOUNTER — Telehealth: Payer: Self-pay | Admitting: Neurology

## 2021-02-25 DIAGNOSIS — G6289 Other specified polyneuropathies: Secondary | ICD-10-CM

## 2021-02-25 NOTE — Telephone Encounter (Signed)
Please call patient, repeat laboratory evaluation continue to show low titer positive RPR,  I have ordered per suggestion Orders Placed This Encounter  Procedures   T pallidum Screening [place]      She may come without appointment to have the laboratory evaluation, or combine it with her next office follow-up visit

## 2021-02-25 NOTE — Telephone Encounter (Addendum)
I spoke to the patient and provided her with the results. She is agreeable to the repeat labs. She would like to come in this week rather than wait until her next appt. She is aware of our office hours.

## 2021-02-25 NOTE — Telephone Encounter (Signed)
I called patient to discuss. No answer, left a message asking her to call us back. If patient calls back another day please route to POD 2. 

## 2021-02-28 ENCOUNTER — Other Ambulatory Visit: Payer: Self-pay

## 2021-02-28 ENCOUNTER — Telehealth: Payer: Self-pay | Admitting: Neurology

## 2021-02-28 ENCOUNTER — Ambulatory Visit: Payer: Medicare Other | Attending: Neurology

## 2021-02-28 ENCOUNTER — Other Ambulatory Visit (INDEPENDENT_AMBULATORY_CARE_PROVIDER_SITE_OTHER): Payer: Self-pay

## 2021-02-28 DIAGNOSIS — M6281 Muscle weakness (generalized): Secondary | ICD-10-CM | POA: Diagnosis present

## 2021-02-28 DIAGNOSIS — R262 Difficulty in walking, not elsewhere classified: Secondary | ICD-10-CM | POA: Diagnosis present

## 2021-02-28 DIAGNOSIS — R2689 Other abnormalities of gait and mobility: Secondary | ICD-10-CM | POA: Insufficient documentation

## 2021-02-28 DIAGNOSIS — R2681 Unsteadiness on feet: Secondary | ICD-10-CM | POA: Diagnosis present

## 2021-02-28 DIAGNOSIS — Z0289 Encounter for other administrative examinations: Secondary | ICD-10-CM

## 2021-02-28 DIAGNOSIS — G6289 Other specified polyneuropathies: Secondary | ICD-10-CM

## 2021-02-28 NOTE — Telephone Encounter (Signed)
Pt is needing a letter for housing stating that she is needing a ground floor apt due to not being able to go up stairs. Please advise.

## 2021-02-28 NOTE — Therapy (Signed)
[facility] [address] [place], [place], [address] Phone: [phone]   Fax:  [phone]  Physical Therapy Treatment/Recert/Progress note  Patient Details  Name: Tonya Chaney MRN: [id number] Date of Birth: 30-Jul-1945 Referring Provider (PT): Dr. Marcial Pacas   Progress Note  Reporting period 12/19/20 to 02/28/21  See Note below for Objective Data and Assessment of Progress/Goals   Encounter Date: 02/28/2021   PT End of Session - 02/28/21 2458     Visit Number 8    Number of Visits 11    Date for PT Re-Evaluation 03/21/21    Authorization Type UHC medicare so 10th visit progress note    Progress Note Due on Visit 18    PT Start Time 0935    PT Stop Time 1013    PT Time Calculation (min) 38 min    Equipment Utilized During Treatment Gait belt    Activity Tolerance Patient tolerated treatment well    Behavior During Therapy [facility] for tasks assessed/performed             Past Medical History:  Diagnosis Date   Allergies    Arthritis    Diabetes mellitus without complication ([place])    GERD (gastroesophageal reflux disease)    Hypertension    Vertigo    04/30/20- not in a couple of years    Past Surgical History:  Procedure Laterality Date   ABDOMINAL HYSTERECTOMY     CHOLECYSTECTOMY     COLONOSCOPY     LUMBAR LAMINECTOMY/DECOMPRESSION MICRODISCECTOMY Bilateral 05/01/2020   Procedure: Laminectomy and Foraminotomy - Lumbar Two-three, Lumbar Three-Four bilateral;  Surgeon: Eustace Moore, MD;  Location: [facility];  Service: Neurosurgery;  Laterality: Bilateral;  posterior   ROTATOR CUFF REPAIR Right     There were no vitals filed for this visit.   Subjective Assessment - 02/28/21 0938     Subjective Pt reports that she missed due to some family issues.    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    Currently in Pain? No/denies    Pain Onset More than a month ago                [facility] PT Assessment - 02/28/21 0941       Assessment   Medical Diagnosis right leg paresthesias, neck pain, gait abnormality.    Referring Provider (PT) Dr. Marcial Pacas    Onset Date/Surgical Date 11/28/20      High Level Balance   High Level Balance Comments MCTSIB 120 sec with 4/4 conditions. Did have slight increased sway eyes closed.                           [facility] Adult PT Treatment/Exercise - 02/28/21 0941       Ambulation/Gait   Ambulation/Gait Yes    Ambulation/Gait Assistance 5: Supervision    Ambulation/Gait Assistance Details Pt reports that towards end pt had increased pain in bilateral hips up to 10/10. After sitting for a minute pain resolved. Walked reciprocal steps over 4 hurdles, weaving in and out of 4 cones and over  blue mat supervision/CGA on hurdles. Cued to try to go a little more slow and controlled. Pt doesn't trust RLE in SLS coming off quicker.    Ambulation Distance (Feet) 345 Feet    Assistive device None    Gait Pattern Step-through pattern    Ambulation Surface Level;Indoor      Neuro Re-ed    Neuro Re-ed Details  Next to counter: reciprocal stepping over 4 hurdles x 6 bouts CGA. Pt improved as went on but had to touch counter a couple times due to LOB to right. Side stepping over 4 hurdles x 4 bouts. Pt more challenged with going towards right. Alternating toe taps on cone 10 x 2 without UE support. Pt was cued to squeeze right glut and quad to stay up tall with SLS on right.                     PT Education - 02/28/21 1022     Education Details Results of testing and recert plan    Person(s) Educated  Patient    Methods Explanation    Comprehension Verbalized understanding              PT Short Term Goals - 02/10/21 0914       PT SHORT TERM GOAL #1   Title Pt will be educated and independent with HEP to facilitate progress in LE strength and balance.    Baseline reports independnece with current HEP; will continue to progress    Time 4    Period Weeks    Status Achieved    Target Date 01/16/21      PT SHORT TERM GOAL #2   Title Pt will be able to ascend/descend 3 steps safely without the use of handrails to allow safe entry and exit from home.    Baseline imbalance noted without use of rails, CGA.  02/10/21 supervision without rails but pt does report 1 rail to hold to now    Time 4    Period Weeks    Status Partially Met    Target Date 01/16/21      PT SHORT TERM GOAL #3   Title Pt will be able to walk at 1.0 m/s w/ 72mwalk test demonstrating improved community ambulation.    Baseline .82 m/s; 0.86 m/s, 02/10/21 1.124m    Time 4    Period Weeks    Status Achieved    Target Date 01/16/21               PT Long Term Goals - 02/28/21 0946       PT LONG TERM GOAL #1   Title Pt will be able to perfom 3 sit to stands without UE assist demonstrating improved LE strength for facilitated transfers and functional mobility. (LTGs due 02/14/21)    Baseline 02/10/21 performed 5 without hands    Time 8    Period Weeks    Status Achieved    Target Date 02/14/21      PT LONG TERM GOAL #2   Title Pt will be able to perform MCTSIB 4/4 without losing balance for a total of 120 seconds on noncomplaint surface demonstrating an improvement in balance to decrease fall risk.    Baseline 12/19/20 3/4 with 105 sec total with last condition affected. 02/28/21 120 sec for 4/4 on all conditions.    Time 8    Period Weeks    Status Achieved    Target Date 02/14/21  PT LONG TERM GOAL #3   Title Pt will be able to peform 5x sit to stand in </= 15 seconds demonstrating an  improvment in balance to decresae falls risk and functional mobility.    Baseline 12/19/20 20.42 sec with hands from chair. 02/10/21 13.34 sec without hands from chair.    Time 8    Period Weeks    Status Achieved    Target Date 02/14/21      PT LONG TERM GOAL #4   Title Pt will be able to walk >500' without AD independently on varied surfaces for improved community mobility.    Baseline 02/10/21 575' without AD on level surfaces independently. 02/28/21 On varied surfaces pt is supervision.    Time 8    Period Weeks    Status Partially Met    Target Date 02/14/21             Updated goals:  PT Short Term Goals - 02/28/21 1030       PT SHORT TERM GOAL #1   Title STGs=LTGs             PT Long Term Goals - 02/28/21 1030       PT LONG TERM GOAL #1   Title Pt will be independent with strengthening, balance and aerobic HEP to transition fully to her community programs.    Time 4    Period Weeks    Status New    Target Date 03/21/21      PT LONG TERM GOAL #2   Title Pt will be able to walk >500' without AD independently on varied surfaces for improved community mobility.    Baseline 02/10/21 575' without AD on level surfaces independently. 02/28/21 On varied surfaces pt is supervision.    Time 4    Period Weeks    Status On-going    Target Date 03/21/21                  Plan - 02/28/21 1025     Clinical Impression Statement Pt has met 3/4 LTGs she improved on 5 x sit to stand to 13.34 sec, and gait speed to 1.83ms indicating improving balance and gait safety. Able to perform all 4 conditions on MCTSIB testing showing improved balance. She is able to meet gait distance goal on level surfaces independently. Varied surfaces were more challenging mostly that she gets some LBP/hip pain. Resolves with sitting and flexion based activity presenting like stenosis. PT continued to work more on increasing SLS time especially on right. Pt will continue to benefit from skilled  PT to finalize her community program including balance training.    Personal Factors and Comorbidities Comorbidity 3+    Comorbidities HTN, DM, vertigo, hx of right lumbar radiculopathy    Examination-Activity Limitations Bend;Stairs;Squat;Locomotion Level;Transfers;Stand;Caring for Others    Examination-Participation Restrictions Meal Prep;Cleaning;Yard Work;Community Activity    Stability/Clinical Decision Making Evolving/Moderate complexity    Rehab Potential Good    PT Frequency 1x / week    PT Duration 4 weeks    PT Treatment/Interventions ADLs/Self Care Home Management;Gait training;Stair training;Functional mobility training;Therapeutic activities;Therapeutic exercise;Balance training;Neuromuscular re-education;Patient/family education;Vestibular;Manual techniques;DME Instruction;Passive range of motion;Canalith Repostioning    PT Next Visit Plan continue to focus on her transition to community fitness program. Pt was going to bring in her gym schedule. She is doing the walking, strengthening class and water aerobics 3x/week. Activiites to promote SLS.    Consulted and Agree with Plan of Care Patient  Patient will benefit from skilled therapeutic intervention in order to improve the following deficits and impairments:  Abnormal gait, Decreased activity tolerance, Decreased balance, Impaired sensation, Decreased strength, Decreased mobility  Visit Diagnosis: Other abnormalities of gait and mobility  Muscle weakness (generalized)  Unsteadiness on feet     Problem List Patient Active Problem List   Diagnosis Date Noted   Peripheral neuropathy 02/05/2021   Lumbar radiculopathy 02/05/2021   Cervical radiculopathy 02/05/2021   Right leg paresthesias 11/28/2020   Neck pain 11/28/2020   Gait abnormality 11/28/2020   S/P lumbar laminectomy 05/01/2020   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

## 2021-03-03 ENCOUNTER — Encounter: Payer: Self-pay | Admitting: *Deleted

## 2021-03-03 NOTE — Telephone Encounter (Signed)
Letter written, signed and placed up front for pick up. I called the patient and provided our operating hours. She will stop by our office.

## 2021-03-04 ENCOUNTER — Telehealth: Payer: Self-pay | Admitting: Neurology

## 2021-03-04 DIAGNOSIS — A530 Latent syphilis, unspecified as early or late: Secondary | ICD-10-CM | POA: Insufficient documentation

## 2021-03-04 LAB — T PALLIDUM SCREENING CASCADE: T pallidum Antibodies (TP-PA): REACTIVE — AB

## 2021-03-04 LAB — RPR, QUANT: RPR, Quant: 1:1 {titer} — ABNORMAL HIGH

## 2021-03-04 LAB — RPR TITER (REFLEX): RPR: REACTIVE — AB

## 2021-03-04 NOTE — Telephone Encounter (Signed)
Repeat RPR remains positive, T. pallidum antibody was positive as well  If she does not have a history of treated syphilis, this will warrant further evaluation,  I will refer her to infectious disease

## 2021-03-05 NOTE — Telephone Encounter (Signed)
I called the pt and relayed results. She verbalized understanding and sts she has not had prior history of treated syphilis. She is agreeable to the infectious disease specialist.

## 2021-03-07 ENCOUNTER — Other Ambulatory Visit: Payer: Self-pay

## 2021-03-07 ENCOUNTER — Ambulatory Visit: Payer: Medicare Other

## 2021-03-07 DIAGNOSIS — R2681 Unsteadiness on feet: Secondary | ICD-10-CM

## 2021-03-07 DIAGNOSIS — R2689 Other abnormalities of gait and mobility: Secondary | ICD-10-CM

## 2021-03-07 DIAGNOSIS — M6281 Muscle weakness (generalized): Secondary | ICD-10-CM

## 2021-03-07 NOTE — Therapy (Signed)
[facility] [address] [place], [place], [address] Phone: [phone]   Fax:  [phone]  Physical Therapy Treatment  Patient Details  Name: Tonya Chaney MRN: [id number] Date of Birth: 26-May-1945 Referring Provider (PT): Dr. Levert Feinstein   Encounter Date: 03/07/2021   PT End of Session - 03/07/21 0939     Visit Number 9    Number of Visits 11    Date for PT Re-Evaluation 03/21/21    Authorization Type UHC medicare so 10th visit progress note    Progress Note Due on Visit 18    PT Start Time 0936    PT Stop Time 1014    PT Time Calculation (min) 38 min    Equipment Utilized During Treatment Gait belt    Activity Tolerance Patient tolerated treatment well    Behavior During Therapy [facility] for tasks assessed/performed             Past Medical History:  Diagnosis Date   Allergies    Arthritis    Diabetes mellitus without complication ([facility])    GERD (gastroesophageal reflux disease)    Hypertension    Vertigo    04/30/20- not in a couple of years    Past Surgical History:  Procedure Laterality Date   ABDOMINAL HYSTERECTOMY     CHOLECYSTECTOMY     COLONOSCOPY     LUMBAR LAMINECTOMY/DECOMPRESSION MICRODISCECTOMY Bilateral 05/01/2020   Procedure: Laminectomy and Foraminotomy - Lumbar Two-three, Lumbar Three-Four bilateral;  Surgeon: Tia Alert, MD;  Location: [facility] OR;  Service: Neurosurgery;  Laterality: Bilateral;  posterior   ROTATOR CUFF REPAIR Right     There were no vitals filed for this visit.   Subjective Assessment - 03/07/21 0939     Subjective Pt brought in her schedule in from the [facility]. She goes 3 days/week. Pt reports 1 fall this week. Not quite sure how it happended but was walking in to living room and went forward. Controlled with left arm on recliner and lowered to floor. Was able to get up on own pretty quickly this time. Denies any injuries or pain.    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    Currently in Pain? No/denies    Pain Onset More than a month ago                               [facility] Adult PT Treatment/Exercise - 03/07/21 0941       Ambulation/Gait   Ambulation/Gait Yes    Ambulation/Gait Assistance 5: Supervision    Ambulation/Gait Assistance Details around in clinic between activities. Occasionally staggers slightly to the right.    Assistive device None    Gait Pattern Step-through pattern    Ambulation Surface Level;Indoor      Neuro Re-ed    Neuro Re-ed Details  At counter:pt performed reciprocal stepping over 4 cones with tapping first x 6 bouts with 1 UE support then side stepping with occasional UE support x 4 bouts. At bottom of step just for light UE support if needed: standing on airex  with feet together eyes closed x 30 sec, eyes closed with head turns left/right x 10 and up/down x 10. Standing on airex with tapping 2nd step with finger tip support on using LLE to weight shift to right x 10. Cues to stay up tall. Pt reported some pain in right hip. Repeated tapping with right x 10 with no UE support. Pain resides pretty quickly when sitting and flexion trunk flexion.      Exercises   Exercises Other Exercises      Knee/Hip Exercises: Aerobic   Nustep Went over use of Nustep with having pt get on and off. Needing cuing for levers initially    Other Aerobic Sci Fit x 6 min level 3 BUE and BLE for improving activity tolerance and strengthening. 5/10 RPE at end and HR=94.                     PT Education - 03/07/21 1022     Education Details Pt to check out gym at [facility] and see  which recumbant stepper they have and let PT know if she has any questions on it. Would be good option if not taking a class that day for some aerobic activity. Starting out at 6 min and increasing from there.    Person(s) Educated Patient    Methods Explanation;Demonstration    Comprehension Verbalized understanding              PT Short Term Goals - 02/28/21 1030       PT SHORT TERM GOAL #1   Title STGs=LTGs               PT Long Term Goals - 02/28/21 1030       PT LONG TERM GOAL #1   Title Pt will be independent with strengthening, balance and aerobic HEP to transition fully to her community programs.    Time 4    Period Weeks    Status New    Target Date 03/21/21      PT LONG TERM GOAL #2   Title Pt will be able to walk >500' without AD independently on varied surfaces for improved community mobility.    Baseline 02/10/21 575' without AD on level surfaces independently. 02/28/21 On varied surfaces pt is supervision.    Time 4    Period Weeks    Status On-going    Target Date 03/21/21                   Plan - 03/07/21 1023     Clinical Impression Statement PT continued to work on higher level balance on compliant surface and increasing SLS time on RLE. Also started on [employer] today as another option she could do at the [facility] if was not doing a class for some reason for more aerobic activity.    Personal Factors and Comorbidities Comorbidity 3+    Comorbidities HTN, DM, vertigo, hx of right lumbar radiculopathy    Examination-Activity Limitations Bend;Stairs;Squat;Locomotion Level;Transfers;Stand;Caring for Others    Examination-Participation Restrictions Meal Prep;Cleaning;Yard Work;Community Activity    Stability/Clinical Decision Making Evolving/Moderate complexity    Rehab Potential Good    PT Frequency 1x / week    PT Duration 4 weeks    PT Treatment/Interventions ADLs/Self Care Home Management;Gait training;Stair training;Functional mobility  training;Therapeutic activities;Therapeutic exercise;Balance training;Neuromuscular re-education;Patient/family education;Vestibular;Manual techniques;DME Instruction;Passive range of motion;Canalith Repostioning    PT Next Visit Plan continue to focus on her transition to community fitness program.  Activiites to promote SLS.    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   Peripheral neuropathy 02/05/2021   Lumbar radiculopathy 02/05/2021   Cervical radiculopathy 02/05/2021   Right leg paresthesias 11/28/2020   Neck pain 11/28/2020   Gait abnormality 11/28/2020   S/P lumbar laminectomy 05/01/2020   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

## 2021-03-14 ENCOUNTER — Ambulatory Visit: Payer: Medicare Other

## 2021-03-14 ENCOUNTER — Other Ambulatory Visit: Payer: Self-pay

## 2021-03-14 DIAGNOSIS — R2689 Other abnormalities of gait and mobility: Secondary | ICD-10-CM

## 2021-03-14 DIAGNOSIS — R2681 Unsteadiness on feet: Secondary | ICD-10-CM

## 2021-03-14 DIAGNOSIS — R262 Difficulty in walking, not elsewhere classified: Secondary | ICD-10-CM

## 2021-03-14 DIAGNOSIS — M6281 Muscle weakness (generalized): Secondary | ICD-10-CM

## 2021-03-14 NOTE — Patient Instructions (Addendum)
NuStep Settings:   Seat Adjustment: 10 Arm Adjustment: 9 Resistance Level: 4   Goal: To complete 6-8 minutes to start, and progress time by adding 1-2 minutes each session.    Access Code: [email or web address] URL: [email or web address] Date: 03/14/2021 Prepared by: Jethro Bastos  Exercises Standing on pillow eyes open and eyes closed - 2 x daily - 5 x weekly - 1 sets - 2 reps - 30 sec hold Standing on pillow with head nods - 2 x daily - 5 x weekly - 2 sets - 10 reps Wide Stance with Head Rotation on Foam Pad - 2 x daily - 5 x weekly - 2 sets - 10 reps Standing Hip Flexion with Resistance Loop - 1 x daily - 5 x weekly - 3 sets - 10 reps Standing Hip Abduction Kicks - 1 x daily - 5 x weekly - 3 sets - 10 reps Supine Single Knee to Chest Stretch - 1 x daily - 5 x weekly - 1 sets - 3 reps - 10-15 seconds hold

## 2021-03-14 NOTE — Therapy (Signed)
[facility] [address] [place], [place], [address] Phone: [phone]   Fax:  [phone]  Physical Therapy Treatment/Progress Note/Discharge Summary  Patient Details  Name: Tonya Chaney MRN: [id number] Date of Birth: [birth year] Referring Provider (PT): Dr. Marcial Pacas PHYSICAL THERAPY DISCHARGE SUMMARY  Visits from Start of Care: 10  Current functional level related to goals / functional outcomes: See Clinical Impression Statement   Remaining deficits: Mild Pain   Education / Equipment: HEP/Community Exercise Program   Patient agrees to discharge. Patient goals were met. Patient is being discharged due to meeting the stated rehab goals.    Physical Therapy Progress Note   Dates of Reporting Period: 12/19/20 - 03/14/21  See Note below for Objective Data and Assessment of Progress/Goals.  Thank you for the referral of this patient. Tonya Chaney, PT, DPT     Encounter Date: 03/14/2021   PT End of Session - 03/14/21 1105     Visit Number 10    Number of Visits 11    Date for PT Re-Evaluation 03/21/21    Authorization Type [employer] medicare so 10th visit progress note    Progress Note Due on Visit 18    PT Start Time 1101    PT Stop Time 1137    PT Time Calculation (min) 36 min    Equipment Utilized During Treatment Gait belt    Activity Tolerance Patient tolerated treatment well    Behavior During Therapy WFL for tasks assessed/performed             Past Medical History:  Diagnosis Date   Allergies    Arthritis    Diabetes mellitus without complication (HCC)    GERD (gastroesophageal reflux disease)    Hypertension    Vertigo    04/30/20- not in a couple of years    Past Surgical History:  Procedure Laterality Date   ABDOMINAL HYSTERECTOMY     CHOLECYSTECTOMY     COLONOSCOPY     LUMBAR LAMINECTOMY/DECOMPRESSION MICRODISCECTOMY Bilateral 05/01/2020   Procedure: Laminectomy and Foraminotomy  - Lumbar Two-three, Lumbar Three-Four bilateral;  Surgeon: Eustace Moore, MD;  Location: [facility];  Service: Neurosurgery;  Laterality: Bilateral;  posterior   ROTATOR CUFF REPAIR Right     There were no vitals filed for this visit.   Subjective Assessment - 03/14/21 1104     Subjective Patient reports some pain/discomfort in the L hip/back area that has lingered since last week. No falls. Reports she has not went to the [facility] yet.    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    Currently in Pain? Yes    Pain Score 8     Pain Location Back    Pain Orientation Left;Lower    Pain Descriptors / Indicators Aching    Pain Type Acute pain    Pain Onset More than a month ago    Aggravating Factors  walking                [facility] PT Assessment - 03/14/21 0001       Assessment   Medical Diagnosis right leg paresthesias, neck pain,  gait abnormality.    Referring Provider (PT) Dr. Marcial Pacas               [facility] Adult PT Treatment/Exercise - 03/14/21 0001       Ambulation/Gait   Ambulation/Gait Yes    Ambulation/Gait Assistance 6: Modified independent (Device/Increase time)    Ambulation/Gait Assistance Details completed gait outdoors on unlevel surfaces paved/grass Mod I. no instances of imbalance noted today.    Ambulation Distance (Feet) 600 Feet    Assistive device None    Gait Pattern Step-through pattern    Ambulation Surface Level;Indoor;Unlevel;Outdoor;Paved;Grass      Exercises   Exercises Knee/Hip    Other Exercises  single knee to chest on LLE 3 x 15 seconds      Knee/Hip Exercises: Aerobic   Nustep Continued trial of  NuStep, with patient able to adjust seat/arm rests, and resistance with minimal cues to demo ability to complete indepdently at [facility]. Completed on Level 4.0 with BUE/BLE x minutes. Provided handout with settings for seat/arm rails.            NuStep Settings:    Seat Adjustment: 10 Arm Adjustment: 9 Resistance Level: 4    Goal: To complete 6-8 minutes to start, and progress time by adding 1-2 minutes each session.     Reviewed HEP and provided updated handout to patient:   Access Code: [email or web address] URL: [email or web address] Date: 03/14/2021 Prepared by: Baldomero Lamy   Exercises Standing on pillow eyes open and eyes closed - 2 x daily - 5 x weekly - 1 sets - 2 reps - 30 sec hold Standing on pillow with head nods - 2 x daily - 5 x weekly - 2 sets - 10 reps Wide Stance with Head Rotation on Foam Pad - 2 x daily - 5 x weekly - 2 sets - 10 reps Standing Hip Flexion with Resistance Loop - 1 x daily - 5 x weekly - 3 sets - 10 reps Standing Hip Abduction Kicks - 1 x daily - 5 x weekly - 3 sets - 10 reps Supine Single Knee to Chest Stretch - 1 x daily - 5 x weekly - 1 sets - 3 reps - 10-15 seconds hold         PT Education - 03/14/21 1112     Education Details NuStep Settings; Progress toward LTGs; HEP additions    Person(s) Educated Patient    Methods Explanation;Handout;Demonstration    Comprehension Returned demonstration;Verbalized understanding              PT Short Term Goals - 02/28/21 1030       PT SHORT TERM GOAL #1   Title STGs=LTGs               PT Long Term Goals - 03/14/21 1126       PT LONG TERM GOAL #1   Title Pt will be independent with strengthening, balance and aerobic HEP to transition fully to her community programs.    Baseline reports independence with HEP, and return to communtiy program.    Time 4    Period Weeks    Status Achieved    Target Date 03/21/21      PT LONG TERM GOAL #2   Title Pt will be able to walk  >500' without AD independently on varied surfaces for improved community mobility.    Baseline 02/10/21 575' without AD on level surfaces independently. 02/28/21 On varied surfaces pt is supervision.; 1/20    Time  4    Period Weeks    Status On-going    Target Date 03/21/21                   Plan - 03/14/21 1139     Clinical Impression Statement Today's skilled PT session focused on assesment of patient's progress toward LTGs. Patient able to meet all LTGs, demosntrating independence with HEP and plans to reutrn to communtiy exercise program. Patient also able to ambulate x 600 ft outdoors on unlevel various surfaces without AD, Mod I with no imbalance noted. Patient demo readiness to d/c from PT services at this time, with patietn agreeable.    Personal Factors and Comorbidities Comorbidity 3+    Comorbidities HTN, DM, vertigo, hx of right lumbar radiculopathy    Examination-Activity Limitations Bend;Stairs;Squat;Locomotion Level;Transfers;Stand;Caring for Others    Examination-Participation Restrictions Meal Prep;Cleaning;Yard Work;Community Activity    Stability/Clinical Decision Making Evolving/Moderate complexity    Rehab Potential Good    PT Frequency 1x / week    PT Duration 4 weeks    PT Treatment/Interventions ADLs/Self Care Home Management;Gait training;Stair training;Functional mobility training;Therapeutic activities;Therapeutic exercise;Balance training;Neuromuscular re-education;Patient/family education;Vestibular;Manual techniques;DME Instruction;Passive range of motion;Canalith Repostioning    PT Next Visit Plan d/c this visit.    Consulted and Agree with Plan of Care Patient             Patient will benefit from skilled therapeutic intervention in order to improve the following deficits and impairments:  Abnormal gait, Decreased activity tolerance, Decreased balance, Impaired sensation, Decreased strength, Decreased mobility  Visit Diagnosis: Other  abnormalities of gait and mobility  Muscle weakness (generalized)  Unsteadiness on feet  Difficulty in walking, not elsewhere classified     Problem List Patient Active Problem List   Diagnosis Date Noted   Positive RPR test 03/04/2021   Peripheral neuropathy 02/05/2021   Lumbar radiculopathy 02/05/2021   Cervical radiculopathy 02/05/2021   Right leg paresthesias 11/28/2020   Neck pain 11/28/2020   Gait abnormality 11/28/2020   S/P lumbar laminectomy 05/01/2020   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

## 2021-03-18 ENCOUNTER — Ambulatory Visit: Payer: Commercial Managed Care - HMO | Admitting: Infectious Diseases

## 2021-03-19 ENCOUNTER — Ambulatory Visit (INDEPENDENT_AMBULATORY_CARE_PROVIDER_SITE_OTHER): Payer: Medicare Other | Admitting: Infectious Diseases

## 2021-03-19 ENCOUNTER — Other Ambulatory Visit: Payer: Self-pay

## 2021-03-19 VITALS — BP 133/79 | HR 80 | Resp 16 | Ht 65.0 in | Wt 246.2 lb

## 2021-03-19 DIAGNOSIS — E669 Obesity, unspecified: Secondary | ICD-10-CM | POA: Insufficient documentation

## 2021-03-19 DIAGNOSIS — Z113 Encounter for screening for infections with a predominantly sexual mode of transmission: Secondary | ICD-10-CM

## 2021-03-19 DIAGNOSIS — Z880 Allergy status to penicillin: Secondary | ICD-10-CM | POA: Diagnosis not present

## 2021-03-19 DIAGNOSIS — A528 Late syphilis, latent: Secondary | ICD-10-CM | POA: Diagnosis not present

## 2021-03-19 MED ORDER — DOXYCYCLINE HYCLATE 100 MG PO TABS: 100.0000 mg | ORAL_TABLET | Freq: Two times a day (BID) | ORAL | 0 refills | Status: AC

## 2021-03-19 NOTE — Progress Notes (Addendum)
Patient Active Problem List   Diagnosis Date Noted   Positive RPR test 03/04/2021   Peripheral neuropathy 02/05/2021   Lumbar radiculopathy 02/05/2021   Cervical radiculopathy 02/05/2021   Right leg paresthesias 11/28/2020   Neck pain 11/28/2020   Gait abnormality 11/28/2020   S/P lumbar laminectomy 05/01/2020   Neurogenic claudication due to lumbar spinal stenosis 04/02/2020    Patient's Medications  New Prescriptions   No medications on file  Previous Medications   ALBUTEROL (VENTOLIN HFA) 108 (90 BASE) MCG/ACT INHALER    Inhale 2 puffs into the lungs every 6 (six) hours as needed for wheezing or shortness of breath.   ASPIRIN 81 MG CHEWABLE TABLET    Chew 81 mg by mouth daily.   AZELASTINE (ASTELIN) 0.1 % NASAL SPRAY    Place 2 sprays into both nostrils daily.   CHOLECALCIFEROL (VITAMIN D) 25 MCG (1000 UNIT) TABLET    Take 1,000 Units by mouth daily.   DEXILANT 60 MG CAPSULE    Take 1 capsule by mouth daily.   DICLOFENAC SODIUM (VOLTAREN) 1 % GEL    Apply 2 g topically 4 (four) times daily.   FEROSUL 325 (65 FE) MG TABLET    Take 325 mg by mouth at bedtime.   FLUTICASONE (FLONASE) 50 MCG/ACT NASAL SPRAY    Place 2 sprays into both nostrils at bedtime.   HUMALOG MIX 75/25 KWIKPEN (75-25) 100 UNIT/ML KWIKPEN    Inject 20-50 Units into the skin See admin instructions. Take 50 units in the morning 20 units at noon and 30 units at bedtime   LOSARTAN (COZAAR) 50 MG TABLET    Take 75 mg by mouth daily.   MECLIZINE (ANTIVERT) 25 MG TABLET    Take 25 mg by mouth 3 (three) times daily as needed for dizziness.   METFORMIN (GLUCOPHAGE-XR) 750 MG 24 HR TABLET    Take 750 mg by mouth 2 (two) times daily.   METHOCARBAMOL (ROBAXIN) 500 MG TABLET    Take 1 tablet (500 mg total) by mouth every 6 (six) hours as needed for muscle spasms.   MONTELUKAST (SINGULAIR) 10 MG TABLET    Take 10 mg by mouth at bedtime.   ROSUVASTATIN (CRESTOR) 5 MG TABLET    Take 5 mg by mouth daily.    SYMBICORT 160-4.5 MCG/ACT INHALER    Inhale 2 puffs into the lungs 2 (two) times daily.   VITAMIN B-12 (CYANOCOBALAMIN) 1000 MCG TABLET    Take 1,000 mcg by mouth 2 (two) times daily.  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 She is a widow and has had 35 female sexual partners so far. Last sexual activity was 3 years ago with her husband. No known h/o syphilis in the sexual partners. Denies any recent illness, hospitalization, sore throat, rashes, lymph node swelling.  Denies headache, blurry vision, hearing changes, neck pain, focal weakness, numbness or neurological complaints. Has chronic back pain.   Lives with her 2 grand children. Denies smoking, alcohol and IVDU. She was a Runner, broadcasting/film/video previously but retired now.   H/p penicillin allergy at the age of 35, had  hives, itching  but no SOB, facial and lip swelling. She has not taken penicillin since then  Review of Systems: ROS Negative for fevers, chills Negative for N/V/D Negative for appetite and weight changes Negative for GU complaints Negative for joint complains and rashes  Chronic back pain +  Past Medical History:  Diagnosis Date   Allergies    Arthritis    Diabetes mellitus without complication (HCC)    GERD (gastroesophageal reflux disease)    Hypertension    Vertigo    04/30/20- not in a couple of years   Past Surgical History:  Procedure Laterality Date   ABDOMINAL HYSTERECTOMY     CHOLECYSTECTOMY     COLONOSCOPY     LUMBAR LAMINECTOMY/DECOMPRESSION MICRODISCECTOMY Bilateral 05/01/2020   Procedure: Laminectomy and Foraminotomy - Lumbar Two-three, Lumbar Three-Four bilateral;  Surgeon: Tia AlertJones, David S, MD;   [facility];  Service: Neurosurgery;  Laterality: Bilateral;  posterior   ROTATOR CUFF REPAIR Right     Social History   Tobacco Use   Smoking status: Never   Smokeless tobacco: Never  Vaping Use   Vaping Use: Never used  Substance Use Topics   Alcohol use: No    No family history on file.  Allergies  Allergen Reactions   Oxycodone Other (See Comments)    lethargey   Tramadol Anxiety   Tuna Flavor [Flavoring Agent] Diarrhea and Nausea And Vomiting   Penicillins Hives, Swelling and Rash    Reaction: patient was 76 years old    Health Maintenance  Topic Date Due   Pneumonia Vaccine 8365+ Years old (1 - PCV) Never done   Hepatitis C Screening  Never done   TETANUS/TDAP  Never done   COLONOSCOPY (Pts 45-5845yrs Insurance coverage will need to be confirmed)  Never done   MAMMOGRAM  Never done   Zoster Vaccines- Shingrix (1 of 2) Never done   DEXA SCAN  Never done   INFLUENZA VACCINE  09/23/2020   COVID-19 Vaccine  Completed   HPV VACCINES  Aged Out    Objective: BP 133/79    Pulse 80    Resp 16    Ht 5\' 5"  (1.651 m)    Wt 246 lb 3.2 oz (111.7 kg)    SpO2 99%    BMI 40.97 kg/m   Physical Exam Constitutional:      Appearance: Normal appearance. Obese  HENT:     Head: Normocephalic and atraumatic.      Mouth: Mucous membranes are moist.  Eyes:    Conjunctiva/sclera: Conjunctivae normal.     Pupils: Pupils are equal, round  Cardiovascular:     Rate and Rhythm: Normal rate and regular rhythm.     Heart sounds:   Pulmonary:     Effort: Pulmonary effort is normal.     Breath sounds: Normal breath sounds.   Abdominal:     General: Abdomen is protruded     Palpations: Abdomen is soft.   Musculoskeletal:        General: Normal range of motion.   Skin:    General: Skin is warm and dry.     Comments:  Neurological:     General: No focal deficit present.     Mental Status: She is alert and oriented to person, place, and time.   Psychiatric:        Mood  and Affect: Mood normal.   Lab Results Lab Results  Component Value Date   WBC 7.4 05/01/2020   HGB 11.9 (  L) 05/01/2020   HCT 37.0 05/01/2020   MCV 87.7 05/01/2020   PLT 280 05/01/2020    Lab Results  Component Value Date   CREATININE 0.75 05/01/2020   BUN 9 05/01/2020   NA 136 05/01/2020   K 3.6 05/01/2020   CL 101 05/01/2020   CO2 26 05/01/2020   No results found for: ALT, AST, GGT, ALKPHOS, BILITOT  No results found for: CHOL, HDL, LDLCALC, LDLDIRECT, TRIG, CHOLHDL Lab Results  Component Value Date   LABRPR Reactive (A) 02/05/2021   No results found for: HIV1RNAQUANT, HIV1RNAVL, CD4TABS   Problem List Items Addressed This Visit       Other   Routine screening for STI (sexually transmitted infection)   Relevant Orders   Hepatitis C antibody   HIV Antibody (routine testing w rflx)   Hepatitis B surface antigen   Hepatitis B surface antibody,qualitative   Hepatitis B core antibody, total   Late latent syphilis - Primary   Relevant Orders   RPR   Penicillin allergy   Relevant Orders   Ambulatory referral to Allergy   Obesity   Assessment/Plan Late latent Syphilis  Will do doxycycline 100mg  PO BID for 28 days given allergy to Penicillin Fu in 4 weeks   STD screening - screen for HIV, hep B and C  Penicillin allergy - referred to allergist   I have personally 65  minutes involved in face-to-face and non-face-to-face activities for this patient on the day of the visit. Professional time spent includes the following activities: Preparing to see the patient (review of tests), Obtaining and/or reviewing separately obtained history (admission/discharge record), Performing a medically appropriate examination and/or evaluation , Ordering medications/tests/procedures, referring and communicating with other health care professionals, Documenting clinical information in the EMR, Independently interpreting results (not separately reported), Communicating results to the  patient/family/caregiver, Counseling and educating the patient/family/caregiver and Care coordination (not separately reported).   , MD [facility] 03/19/2021, 9:01 AM

## 2021-03-21 LAB — HIV ANTIBODY (ROUTINE TESTING W REFLEX): HIV 1&2 Ab, 4th Generation: NONREACTIVE

## 2021-03-21 LAB — HEPATITIS B SURFACE ANTIGEN: Hepatitis B Surface Ag: NONREACTIVE

## 2021-03-21 LAB — RPR TITER: RPR Titer: 1:1 {titer} — ABNORMAL HIGH

## 2021-03-21 LAB — HEPATITIS C ANTIBODY
Hepatitis C Ab: NONREACTIVE
SIGNAL TO CUT-OFF: <0.02 (ref <1.00)

## 2021-03-21 LAB — FLUORESCENT TREPONEMAL AB(FTA)-IGG-BLD: Fluorescent Treponemal ABS: REACTIVE — AB

## 2021-03-21 LAB — RPR: RPR Ser Ql: REACTIVE — AB

## 2021-03-21 LAB — HEPATITIS B CORE ANTIBODY, TOTAL: Hep B Core Total Ab: NONREACTIVE

## 2021-03-21 LAB — HEPATITIS B SURFACE ANTIBODY,QUALITATIVE: Hep B S Ab: NONREACTIVE

## 2021-04-18 ENCOUNTER — Encounter: Payer: Self-pay | Admitting: Infectious Diseases

## 2021-04-18 ENCOUNTER — Other Ambulatory Visit: Payer: Self-pay

## 2021-04-18 ENCOUNTER — Ambulatory Visit (INDEPENDENT_AMBULATORY_CARE_PROVIDER_SITE_OTHER): Payer: Medicare Other | Admitting: Infectious Diseases

## 2021-04-18 VITALS — BP 138/79 | HR 83 | Temp 98.5°F | Ht 65.0 in | Wt 247.0 lb

## 2021-04-18 DIAGNOSIS — Z880 Allergy status to penicillin: Secondary | ICD-10-CM

## 2021-04-18 DIAGNOSIS — Z7185 Encounter for immunization safety counseling: Secondary | ICD-10-CM | POA: Diagnosis not present

## 2021-04-18 DIAGNOSIS — A528 Late syphilis, latent: Secondary | ICD-10-CM | POA: Diagnosis not present

## 2021-04-18 DIAGNOSIS — Z5181 Encounter for therapeutic drug level monitoring: Secondary | ICD-10-CM

## 2021-04-18 NOTE — Progress Notes (Signed)
Patient Active Problem List   Diagnosis Date Noted   Routine screening for STI (sexually transmitted infection) 03/19/2021   Late latent syphilis 03/19/2021   Penicillin allergy 03/19/2021   Obesity 03/19/2021   Positive RPR test 03/04/2021   Peripheral neuropathy 02/05/2021   Lumbar radiculopathy 02/05/2021   Cervical radiculopathy 02/05/2021   Right leg paresthesias 11/28/2020   Neck pain 11/28/2020   Gait abnormality 11/28/2020   S/P lumbar laminectomy 05/01/2020   Neurogenic claudication due to lumbar spinal stenosis 04/02/2020    Patient's Medications  New Prescriptions   No medications on file  Previous Medications   ALBUTEROL (VENTOLIN HFA) 108 (90 BASE) MCG/ACT INHALER    Inhale 2 puffs into the lungs every 6 (six) hours as needed for wheezing or shortness of breath.   ASPIRIN 81 MG CHEWABLE TABLET    Chew 81 mg by mouth daily.   AZELASTINE (ASTELIN) 0.1 % NASAL SPRAY    Place 2 sprays into both nostrils daily.   B-D ULTRAFINE III SHORT PEN 31G X 8 MM MISC    Inject into the skin 2 (two) times daily.   CHOLECALCIFEROL (VITAMIN D) 25 MCG (1000 UNIT) TABLET    Take 1,000 Units by mouth daily.   DEXILANT 60 MG CAPSULE    Take 1 capsule by mouth daily.   FEROSUL 325 (65 FE) MG TABLET    Take 325 mg by mouth at bedtime.   FLUTICASONE (FLONASE) 50 MCG/ACT NASAL SPRAY    Place 2 sprays into both nostrils at bedtime.   FUROSEMIDE (LASIX) 20 MG TABLET    Take by mouth.   HUMALOG MIX 75/25 KWIKPEN (75-25) 100 UNIT/ML KWIKPEN    Inject 20-50 Units into the skin See admin instructions. Take 50 units in the morning 20 units at noon and 30 units at bedtime   LANCETS (ONETOUCH DELICA PLUS LANCET33G) MISC    Apply 1 each topically 4 (four) times daily.   LOSARTAN (COZAAR) 50 MG TABLET    Take 75 mg by mouth daily.   MAGNESIUM 400 MG CAPS    Take by mouth.   MECLIZINE (ANTIVERT) 25 MG TABLET    Take 25 mg by mouth 3 (three) times daily as needed for dizziness.   METFORMIN  (GLUCOPHAGE-XR) 750 MG 24 HR TABLET    Take 750 mg by mouth 2 (two) times daily.   MONTELUKAST (SINGULAIR) 10 MG TABLET    Take 10 mg by mouth at bedtime.   ONETOUCH VERIO TEST STRIP    4 (four) times daily.   POTASSIUM CHLORIDE (KLOR-CON) 10 MEQ TABLET    Take 10 mEq by mouth daily.   ROSUVASTATIN (CRESTOR) 5 MG TABLET    Take 5 mg by mouth daily.   SYMBICORT 160-4.5 MCG/ACT INHALER    Inhale 2 puffs into the lungs 2 (two) times daily.   VITAMIN B-12 (CYANOCOBALAMIN) 1000 MCG TABLET    Take 1,000 mcg by mouth 2 (two) times daily.   VITAMIN B-12 (CYANOCOBALAMIN) 1000 MCG TABLET    Take 1,000 mcg by mouth daily.  Modified Medications   No medications on file  Discontinued Medications   No medications on file    Subjective: Here for follow up for late latent syphilis. She has been taking doxycycline twice a day. Denies missing doses. Last dose of doxycyline today. Denies any issues with medicine. Discussed lab results with her that she is HIV negative and non immune to Hep B. She is unaware about  prior hep B vaccination. Willing to get Hep B vaccine. Has appt with allergy in March 15. No concerns otherwise.   Review of Systems: ROS all systems reviewed and negative except stated as above  Past Medical History:  Diagnosis Date   Allergies    Arthritis    Diabetes mellitus without complication (HCC)    GERD (gastroesophageal reflux disease)    Hypertension    Vertigo    04/30/20- not in a couple of years   Past Surgical History:  Procedure Laterality Date   ABDOMINAL HYSTERECTOMY     CHOLECYSTECTOMY     COLONOSCOPY     LUMBAR LAMINECTOMY/DECOMPRESSION MICRODISCECTOMY Bilateral 05/01/2020   Procedure: Laminectomy and Foraminotomy - Lumbar Two-three, Lumbar Three-Four bilateral;  Surgeon: Tia Alert, MD;  Location: [facility] OR;  Service: Neurosurgery;  Laterality: Bilateral;  posterior   ROTATOR CUFF REPAIR Right     Social History   Tobacco Use   Smoking status: Never   Smokeless  tobacco: Never  Vaping Use   Vaping Use: Never used  Substance Use Topics   Alcohol use: No    No family history on file.  Allergies  Allergen Reactions   Oxycodone Other (See Comments)    lethargey   Tramadol Anxiety   Tuna Flavor [Flavoring Agent] Diarrhea and Nausea And Vomiting   Penicillins Hives, Swelling and Rash    Reaction: patient was 76 years old    Health Maintenance  Topic Date Due   TETANUS/TDAP  Never done   COLONOSCOPY (Pts 45-40yrs Insurance coverage will need to be confirmed)  Never done   MAMMOGRAM  Never done   Zoster Vaccines- Shingrix (1 of 2) Never done   DEXA SCAN  Never done   Pneumonia Vaccine 35+ Years old  Completed   INFLUENZA VACCINE  Completed   COVID-19 Vaccine  Completed   Hepatitis C Screening  Completed   HPV VACCINES  Aged Out    Objective: BP 138/79    Pulse 83    Temp 98.5 F (36.9 C) (Oral)    Ht 5\' 5"  (1.651 m)    Wt 247 lb (112 kg)    SpO2 98%    BMI 41.10 kg/m   Physical Exam Constitutional:      Appearance: Normal appearance.  HENT:     Head: Normocephalic and atraumatic.      Mouth: Mucous membranes are moist.  Eyes:    Conjunctiva/sclera: Conjunctivae normal.     Pupils: Pupils are equal, round  Cardiovascular:     Rate and Rhythm: Normal rate and regular rhythm.     Heart sounds:   Pulmonary:     Effort: Pulmonary effort is normal.     Breath sounds:   Abdominal:     General: Non distended     Palpations: soft.   Musculoskeletal:        General: Normal range of motion.   Skin:    General: Skin is warm and dry.     Comments:  Neurological:     General: grossly non focal     Mental Status: awake, alert and oriented to person, place, and time.   Psychiatric:        Mood and Affect: Mood normal.   Lab Results Lab Results  Component Value Date   WBC 7.4 05/01/2020   HGB 11.9 (L) 05/01/2020   HCT 37.0 05/01/2020   MCV 87.7 05/01/2020   PLT 280 05/01/2020    Lab Results  Component Value Date  CREATININE 0.75 05/01/2020   BUN 9 05/01/2020   NA 136 05/01/2020   K 3.6 05/01/2020   CL 101 05/01/2020   CO2 26 05/01/2020   No results found for: ALT, AST, GGT, ALKPHOS, BILITOT  No results found for: CHOL, HDL, LDLCALC, LDLDIRECT, TRIG, CHOLHDL Lab Results  Component Value Date   LABRPR REACTIVE (A) 03/19/2021   RPRTITER 1:1 (H) 03/19/2021   No results found for: HIV1RNAQUANT, HIV1RNAVL, CD4TABS   Problem List Items Addressed This Visit       Other   Late latent syphilis - Primary   Penicillin allergy   Immunization counseling   Relevant Orders   Heplisav-B (HepB-CPG) Vaccine (Completed)   Medication monitoring encounter     # Late latent syphilis - complete 28 days course of doxycycline  # Need for Hepatitis B immunization - Start Hep B series  # Medication monitoring - tolerating doxycycline without issues.   # Pencillin allergy - she has an appt upcoming with allergist   I have personally spent more than 70 minutes involved in face-to-face and non-face-to-face activities for this patient on the day of the visit including staff time, coordination of care and counseling of the patient.  Victoriano Lain, MD [facility] 04/18/2021, 2:45 PM

## 2021-04-19 DIAGNOSIS — Z5181 Encounter for therapeutic drug level monitoring: Secondary | ICD-10-CM | POA: Insufficient documentation

## 2021-05-23 ENCOUNTER — Emergency Department (HOSPITAL_BASED_OUTPATIENT_CLINIC_OR_DEPARTMENT_OTHER): Payer: Medicare Other

## 2021-05-23 ENCOUNTER — Other Ambulatory Visit: Payer: Self-pay

## 2021-05-23 ENCOUNTER — Encounter (HOSPITAL_BASED_OUTPATIENT_CLINIC_OR_DEPARTMENT_OTHER): Payer: Self-pay | Admitting: Emergency Medicine

## 2021-05-23 ENCOUNTER — Emergency Department (HOSPITAL_BASED_OUTPATIENT_CLINIC_OR_DEPARTMENT_OTHER)
Admission: EM | Admit: 2021-05-23 | Discharge: 2021-05-23 | Disposition: A | Discharge status: Home / Self Care | Payer: Medicare Other | Attending: Emergency Medicine | Admitting: Emergency Medicine

## 2021-05-23 DIAGNOSIS — S199XXA Unspecified injury of neck, initial encounter: Secondary | ICD-10-CM | POA: Diagnosis present

## 2021-05-23 DIAGNOSIS — S2001XA Contusion of right breast, initial encounter: Secondary | ICD-10-CM | POA: Diagnosis not present

## 2021-05-23 DIAGNOSIS — E119 Type 2 diabetes mellitus without complications: Secondary | ICD-10-CM | POA: Insufficient documentation

## 2021-05-23 DIAGNOSIS — S4992XA Unspecified injury of left shoulder and upper arm, initial encounter: Secondary | ICD-10-CM | POA: Insufficient documentation

## 2021-05-23 DIAGNOSIS — S4991XA Unspecified injury of right shoulder and upper arm, initial encounter: Secondary | ICD-10-CM | POA: Diagnosis not present

## 2021-05-23 DIAGNOSIS — Y92410 Unspecified street and highway as the place of occurrence of the external cause: Secondary | ICD-10-CM | POA: Insufficient documentation

## 2021-05-23 DIAGNOSIS — S29001A Unspecified injury of muscle and tendon of front wall of thorax, initial encounter: Secondary | ICD-10-CM | POA: Diagnosis not present

## 2021-05-23 DIAGNOSIS — I10 Essential (primary) hypertension: Secondary | ICD-10-CM | POA: Diagnosis not present

## 2021-05-23 DIAGNOSIS — Z79899 Other long term (current) drug therapy: Secondary | ICD-10-CM | POA: Insufficient documentation

## 2021-05-23 DIAGNOSIS — S161XXA Strain of muscle, fascia and tendon at neck level, initial encounter: Secondary | ICD-10-CM | POA: Diagnosis not present

## 2021-05-23 DIAGNOSIS — Z7982 Long term (current) use of aspirin: Secondary | ICD-10-CM | POA: Insufficient documentation

## 2021-05-23 DIAGNOSIS — Z7984 Long term (current) use of oral hypoglycemic drugs: Secondary | ICD-10-CM | POA: Diagnosis not present

## 2021-05-23 MED ORDER — METHOCARBAMOL 500 MG PO TABS: 500.0000 mg | ORAL_TABLET | Freq: Two times a day (BID) | ORAL | 0 refills | Status: AC

## 2021-05-23 MED ORDER — LIDOCAINE 5 % EX PTCH
1.0000 | MEDICATED_PATCH | CUTANEOUS | 0 refills | Status: AC
Start: 2021-05-23 — End: ?

## 2021-05-23 MED ORDER — LIDOCAINE 5 % EX PTCH
1.0000 | MEDICATED_PATCH | CUTANEOUS | Status: DC
  Administered 2021-05-23: 1 via TRANSDERMAL
  Filled 2021-05-23: qty 1
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
## 2021-05-23 NOTE — Discharge Instructions (Addendum)
Apply Lidoderm patch as needed as prescribed.  Take Tylenol as needed as directed for pain.  Limited use of Robaxin if needed for muscle spasms.  Do not take if you are driving or operating machinery.  Use caution the first time to use medication as it can make you unsteady on your feet. ?Follow-up with your doctor for recheck next week.  Return to the emergency room for any worsening or concerning symptoms. ?

## 2021-06-23 ENCOUNTER — Ambulatory Visit: Payer: Medicare Other

## 2021-06-24 ENCOUNTER — Other Ambulatory Visit: Payer: Self-pay

## 2021-06-24 ENCOUNTER — Ambulatory Visit (INDEPENDENT_AMBULATORY_CARE_PROVIDER_SITE_OTHER): Payer: Medicare Other

## 2021-06-24 DIAGNOSIS — Z23 Encounter for immunization: Secondary | ICD-10-CM

## 2021-08-11 NOTE — Progress Notes (Unsigned)
No chief complaint on file.      ASSESSMENT AND PLAN  Tonya Chaney is a 76 y.o. 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 at C7-T1, C5-6, C6-7 level with severe bilateral foraminal stenosis, there was no cord signal abnormality,  Patient denies significant neck pain, occasionally urinary urgency, frequency, still function, cannot ambulate without a cane, she does not want to proceed with any surgical prevention at this point, less likely she is optimal surgical candidate due to multilevel stenosis  Peripheral neuropathy  Length dependent, severe, axonal sensorimotor polyneuropathy, most consistent with her poorly controlled diabetes, previous A1c 11.5,  Laboratory evaluation repeat A1c, rule out other possible causes of peripheral neuropathy  Status post right L2-3, L3-4 hemilaminectomy, medial facetectomy, and foraminotomy in March 2022,  Continued right lower extremity paresthesia, occasionally radiating pain,  EMG nerve conduction study today confirmed residual right L4-5 more than S1 chronic right lumbar radiculopathy,  Gait abnormality  Multifactorial, hammertoes, flatfeet, peripheral neuropathy, cervical spondylitic myelopathy, overweight, aging, deconditioning,  I have suggested her using cane as needed, continue moderate exercise, she would benefit most from optimal control of her diabetes.   DIAGNOSTIC DATA (LABS, IMAGING, TESTING) - I reviewed patient records, labs, notes, testing and imaging myself where available.   MEDICAL HISTORY:  Tonya Chaney is a 76 year old female, seen in request by her primary care physician Dr. Andrey Campanile, Ranae Pila, for evaluation of bilateral feet paresthesia, initial evaluation was on November 28, 2020   I reviewed and summarized the referring note. 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 Marikay Alar  She reported significant improvement of her low back pain, radiating pain to  right leg, but continues to have persistent right leg numbness radiating from right lateral thigh into right lateral leg, to top of right foot, she felt right foot swelling,  Also complains of urinary urgency, occasional incontinence, continue have gait abnormality,  She also complains of neck pain, radiating pain to bilateral shoulder  UPDATE Feb 05 2021 Patient returns for evaluation of her worsening gait abnormality, lower extremity paresthesia, intermittent hands paresthesia  EMG nerve conduction study today which showed evidence of severe length dependent axonal sensorimotor polyneuropathy, also evidence of active neuropathic change involving right L4-5 more than S1 myotomes, consistent with her previous history of right lumbar radiculopathy  We also personally reviewed MRI of cervical spine in October 2022, multilevel degenerative changes, moderate to severe spinal stenosis at C7-T1, C6-7, C5-6, with severe bilateral foraminal stenosis, C4-5, mild spinal stenosis, with severe bilateral foraminal stenosis,  Update August 12, 2021 SS:    PHYSICAL EXAM: There is no height or weight on file to calculate BMI.  PHYSICAL EXAMNIATION:  Gen: NAD, conversant, well nourised, well groomed                      Cardiovascular: Regular rate rhythm, no  peripheral edema, warm, nontender. Eyes: Conjunctivae clear without exudates or hemorrhage Neck: Supple, no carotid bruits. Pulmonary: Clear to auscultation bilaterally   NEUROLOGICAL EXAM:  MENTAL STATUS: Speech/cognition: Awake, alert, oriented to history taking and casual conversation   CRANIAL NERVES: CN II: Visual fields are full to confrontation. Pupils are round equal and briskly reactive to light. CN III, IV, VI: extraocular movement are normal. No ptosis. CN V: Facial sensation is intact to light touch CN VII: Face is symmetric with normal eye closure  CN VIII: Hearing is normal to causal conversation. CN IX, X: Phonation is normal. CN XI: Head turning and shoulder shrug are intact  MOTOR: Limited examination of proximal left upper extremity due to left shoulder pain, right hand intrinsic muscle atrophy, moderate finger abduction weakness,  Mild bilateral lower extremity proximal muscle weakness, with mild right toe extension weakness.  REFLEXES: Reflexes are 2+ and symmetric at the biceps, triceps, 3/3 knees, and absent at ankle, bilateral plantar responses were flexor,  SENSORY: Length dependent decreased to light touch, pinprick and vibratory sensation at ankles  COORDINATION: There is no trunk or limb dysmetria noted.  GAIT/STANCE: She needs push-up to get up from seated position, flatfeet, wide-based, stiff, unsteady  REVIEW OF SYSTEMS:  Full 14 system review of systems performed and notable only for as above All other review of systems were negative.   ALLERGIES: Allergies  Allergen Reactions   Oxycodone Other (See Comments)    lethargey   Tramadol Anxiety   Tuna Flavor [Flavoring Agent] Diarrhea and Nausea And Vomiting   Penicillins Hives, Swelling and Rash    Reaction: patient was 76 years old    HOME MEDICATIONS: Current Outpatient Medications  Medication Sig Dispense Refill   albuterol (VENTOLIN HFA) 108  (90 Base) MCG/ACT inhaler Inhale 2 puffs into the lungs every 6 (six) hours as needed for wheezing or shortness of breath.     aspirin 81 MG chewable tablet Chew 81 mg by mouth daily.     azelastine (ASTELIN) 0.1 % nasal spray Place 2 sprays into both nostrils daily.     B-D ULTRAFINE III SHORT PEN 31G X 8 MM MISC Inject into the skin 2 (two) times daily.     cholecalciferol (VITAMIN D) 25 MCG (1000 UNIT) tablet Take 1,000 Units by mouth daily.     DEXILANT 60 MG capsule Take 1 capsule by mouth daily.     FEROSUL 325 (65 Fe) MG tablet Take 325 mg by mouth at bedtime.     fluticasone (FLONASE) 50 MCG/ACT nasal spray Place 2 sprays into both nostrils at bedtime.     furosemide (LASIX) 20 MG tablet Take by mouth.     HUMALOG MIX 75/25 KWIKPEN (75-25) 100 UNIT/ML Kwikpen Inject 20-50 Units into the skin See admin instructions. Take 50 units in the morning 20 units at noon and 30 units at bedtime     Lancets (ONETOUCH DELICA PLUS LANCET33G) MISC Apply 1 each topically 4 (four) times daily.     lidocaine (LIDODERM) 5 % Place 1 patch onto the skin daily. Remove & Discard patch within 12 hours or as directed by MD 30 patch 0   losartan (COZAAR) 50 MG tablet Take 75 mg by mouth daily.     Magnesium 400 MG CAPS Take by mouth.     meclizine (ANTIVERT) 25 MG tablet Take 25 mg by mouth 3 (three) times daily as needed for dizziness.     metFORMIN (GLUCOPHAGE-XR) 750 MG 24 hr tablet Take 750  mg by mouth 2 (two) times daily.     methocarbamol (ROBAXIN) 500 MG tablet Take 1 tablet (500 mg total) by mouth 2 (two) times daily. 10 tablet 0   montelukast (SINGULAIR) 10 MG tablet Take 10 mg by mouth at bedtime.     ONETOUCH VERIO test strip 4 (four) times daily.     potassium chloride (KLOR-CON) 10 MEQ tablet Take 10 mEq by mouth daily.     rosuvastatin (CRESTOR) 5 MG tablet Take 5 mg by mouth daily.     SYMBICORT 160-4.5 MCG/ACT inhaler Inhale 2 puffs into the lungs 2 (two) times daily.     vitamin B-12  (CYANOCOBALAMIN) 1000 MCG tablet Take 1,000 mcg by mouth 2 (two) times daily.     vitamin B-12 (CYANOCOBALAMIN) 1000 MCG tablet Take 1,000 mcg by mouth daily.     No current facility-administered medications for this visit.    PAST MEDICAL HISTORY: Past Medical History:  Diagnosis Date   Allergies    Arthritis    Diabetes mellitus without complication (HCC)    GERD (gastroesophageal reflux disease)    Hypertension    Vertigo    04/30/20- not in a couple of years    PAST SURGICAL HISTORY: Past Surgical History:  Procedure Laterality Date   ABDOMINAL HYSTERECTOMY     CHOLECYSTECTOMY     COLONOSCOPY     LUMBAR LAMINECTOMY/DECOMPRESSION MICRODISCECTOMY Bilateral 05/01/2020   Procedure: Laminectomy and Foraminotomy - Lumbar Two-three, Lumbar Three-Four bilateral;  Surgeon: Tia Alert, MD;  [facility]: [facility];  Service: Neurosurgery;  Laterality: Bilateral;  posterior   ROTATOR CUFF REPAIR Right     FAMILY HISTORY: No family history on file.  SOCIAL HISTORY: Social History   Socioeconomic History   Marital status: Widowed    Spouse name: Not on file   Number of children: Not on file   Years of education: Not on file   Highest education level: Not on file  Occupational History   Not on file  Tobacco Use   Smoking status: Never   Smokeless tobacco: Never  Vaping Use   Vaping Use: Never used  Substance and Sexual Activity   Alcohol use: No   Drug use: Never   Sexual activity: Not on file  Other Topics Concern   Not on file  Social History Narrative   Lives with 2 great grandchildre   Right Handed   Drinks 1-2 cups caffeine   Social Determinants of Health   Financial Resource Strain: Not on file  Food Insecurity: Not on file  Transportation Needs: Not on file  Physical Activity: Not on file  Stress: Not on file  Social Connections: Not on file  Intimate Partner Violence: Not on file    Margie Ege, Edrick Oh, DNP  [facility] [address],  [address] [place], [place] [address] [phone]

## 2021-08-12 ENCOUNTER — Ambulatory Visit (INDEPENDENT_AMBULATORY_CARE_PROVIDER_SITE_OTHER): Payer: Medicare Other | Admitting: Neurology

## 2021-08-12 ENCOUNTER — Encounter: Payer: Self-pay | Admitting: Neurology

## 2021-08-12 VITALS — BP 133/67 | HR 72 | Ht 65.0 in | Wt 245.0 lb

## 2021-08-12 DIAGNOSIS — M5412 Radiculopathy, cervical region: Secondary | ICD-10-CM | POA: Diagnosis not present

## 2021-08-12 DIAGNOSIS — G6289 Other specified polyneuropathies: Secondary | ICD-10-CM | POA: Diagnosis not present
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
## 2021-08-22 ENCOUNTER — Ambulatory Visit (INDEPENDENT_AMBULATORY_CARE_PROVIDER_SITE_OTHER): Payer: Medicare Other

## 2021-08-22 ENCOUNTER — Ambulatory Visit (INDEPENDENT_AMBULATORY_CARE_PROVIDER_SITE_OTHER): Payer: Medicare Other | Admitting: Orthopaedic Surgery

## 2021-08-22 ENCOUNTER — Encounter: Payer: Self-pay | Admitting: Orthopaedic Surgery

## 2021-08-22 DIAGNOSIS — M542 Cervicalgia: Secondary | ICD-10-CM | POA: Diagnosis not present

## 2021-08-22 DIAGNOSIS — M25512 Pain in left shoulder: Secondary | ICD-10-CM | POA: Diagnosis not present

## 2021-09-03 ENCOUNTER — Ambulatory Visit (HOSPITAL_BASED_OUTPATIENT_CLINIC_OR_DEPARTMENT_OTHER)
Admission: RE | Admit: 2021-09-03 | Discharge: 2021-09-03 | Disposition: A | Discharge status: Home / Self Care | Payer: Medicare Other | Source: Ambulatory Visit | Attending: Orthopaedic Surgery | Admitting: Orthopaedic Surgery

## 2021-09-03 DIAGNOSIS — M25512 Pain in left shoulder: Secondary | ICD-10-CM | POA: Diagnosis not present

## 2021-09-04 ENCOUNTER — Other Ambulatory Visit: Payer: Medicare Other

## 2021-09-12 ENCOUNTER — Ambulatory Visit (INDEPENDENT_AMBULATORY_CARE_PROVIDER_SITE_OTHER): Payer: Medicare Other | Admitting: Orthopaedic Surgery

## 2021-09-12 ENCOUNTER — Encounter: Payer: Self-pay | Admitting: Orthopaedic Surgery

## 2021-09-12 DIAGNOSIS — M25512 Pain in left shoulder: Secondary | ICD-10-CM

## 2021-09-12 NOTE — Progress Notes (Signed)
Office Visit Note   Patient: Tonya Chaney           Date of Birth: [birth year]           MRN: [id number] Visit Date: 09/12/2021              Requested by: Tonya Manges, DO [address] [place],  [place] [address] PCP: Tonya Manges, DO   Assessment & Plan: Visit Diagnoses:  1. Acute pain of left shoulder     Plan: MRI shows a 4 cm retracted tear of the supraspinatus.  There is moderate atrophy of the muscle belly of the supraspinatus.  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  Given her age and atrophy and the fact that she has decent function that is limited by pain I think is worthwhile to see if we can manage the pain with injections and hopefully this will improve the function.  We will make a referral to Dr. Alvester Morin for intra-articular shoulder injection.  We will have her restart physical therapy once she starts to feel improvement from the injections.  I would like to recheck her in 8 weeks.  Follow-Up Instructions: Return in about 8 weeks (around 11/07/2021).   Orders:  Orders Placed This Encounter  Procedures   Ambulatory referral to Physical Therapy   No orders of the defined types were placed in this encounter.     Procedures: No procedures performed   Clinical Data: No additional findings.   Subjective: Chief Complaint  Patient presents with   Left Shoulder - Follow-up    MRI review    HPI Tonya Chaney returns today to discuss left shoulder MRI scan. Review of Systems   Objective: Vital Signs: There were no vitals taken for this visit.  Physical Exam  Ortho Exam Examination of left shoulder shows active abduction to 90 degrees with pain and forward flexion to 130 with pain.  Passive range of motion is normal with pain. Specialty Comments:  No specialty comments available.  Imaging: No results found.   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  Past Surgical History:  Procedure Laterality Date   ABDOMINAL HYSTERECTOMY     CHOLECYSTECTOMY     COLONOSCOPY     LUMBAR LAMINECTOMY/DECOMPRESSION MICRODISCECTOMY Bilateral 05/01/2020   Procedure: Laminectomy and Foraminotomy - Lumbar Two-three, Lumbar Three-Four bilateral;  Surgeon: Tia Alert, MD;  Location: [facility] OR;  Service: Neurosurgery;  Laterality: Bilateral;  posterior   ROTATOR CUFF REPAIR Right    Social History   Occupational History   Not on file  Tobacco Use   Smoking status: Never  Smokeless tobacco: Never  Vaping Use   Vaping Use: Never used  Substance and Sexual Activity   Alcohol use: No   Drug use: Never   Sexual activity: Not on file

## 2021-09-16 ENCOUNTER — Ambulatory Visit: Payer: Self-pay

## 2021-09-16 ENCOUNTER — Ambulatory Visit (INDEPENDENT_AMBULATORY_CARE_PROVIDER_SITE_OTHER): Payer: Medicare Other | Admitting: Physical Medicine and Rehabilitation

## 2021-09-16 ENCOUNTER — Encounter: Payer: Self-pay | Admitting: Physical Medicine and Rehabilitation

## 2021-09-16 DIAGNOSIS — M25512 Pain in left shoulder: Secondary | ICD-10-CM | POA: Diagnosis not present

## 2021-09-16 DIAGNOSIS — G8929 Other chronic pain: Secondary | ICD-10-CM

## 2021-09-16 NOTE — Progress Notes (Signed)
Pt state left shoulder pain. Pt state lifting makes the pain worse. Pt state she takes pain meds to help ease her pain.  Numeric Pain Rating Scale and Functional Assessment Average Pain 7   In the last MONTH (on 0-10 scale) has pain interfered with the following?  1. General activity like being  able to carry out your everyday physical activities such as walking, climbing stairs, carrying groceries, or moving a chair?  Rating(10)   -BT, -Dye Allergies.

## 2021-09-16 NOTE — Progress Notes (Signed)
   Tonya Chaney - 76 y.o. female MRN [id number]  Date of birth: 09/10/1945  Office Visit Note: Visit Date: 09/16/2021 PCP: Yolanda Manges, DO Referred by: Yolanda Manges, DO  Subjective: Chief Complaint  Patient presents with   Left Shoulder - Pain   HPI:  Tonya Chaney is a 76 y.o. female who comes in today at the request of Dr. Glee Arvin for planned Left anesthetic glenohumeral arthrogram with fluoroscopic guidance.  The patient has failed conservative care including home exercise, medications, time and activity modification.  This injection will be diagnostic and hopefully therapeutic.  Please see requesting physician notes for further details and justification.   ROS Otherwise per HPI.  Assessment & Plan: Visit Diagnoses:    ICD-10-CM   1. Chronic left shoulder pain  M25.512 NCV with EMG (electromyography)   G89.29 XR C-ARM NO REPORT    Large Joint Inj: L glenohumeral      Plan: No additional findings.   Meds & Orders: No orders of the defined types were placed in this encounter.   Orders Placed This Encounter  Procedures   Large Joint Inj: L glenohumeral   XR C-ARM NO REPORT   NCV with EMG (electromyography)    Follow-up: Return for visit to requesting provider as needed.   Procedures: Large Joint Inj: L glenohumeral on 09/16/2021 10:20 AM Indications: pain and diagnostic evaluation Details: 22 G 3.5 in needle, fluoroscopy-guided anteromedial approach  Arthrogram: No  Medications: 40 mg triamcinolone acetonide 40 MG/ML; 5 mL bupivacaine 0.25 % Outcome: tolerated well, no immediate complications  There was excellent flow of contrast producing a partial arthrogram of the glenohumeral joint. The patient did have relief of symptoms during the anesthetic phase of the injection. Procedure, treatment alternatives, risks and benefits explained, specific risks discussed. Consent was given by the patient. Immediately prior to procedure a time out was called to verify  the correct patient, procedure, equipment, support staff and site/side marked as required. Patient was prepped and draped in the usual sterile fashion.          Clinical History: No specialty comments available.     Objective:  VS:  HT:    WT:   BMI:     BP:   HR: bpm  TEMP: ( )  RESP:  Physical Exam   Imaging: No results found.

## 2021-09-22 MED ORDER — TRIAMCINOLONE ACETONIDE 40 MG/ML IJ SUSP
40.0000 mg | INTRAMUSCULAR | Status: AC | PRN
  Administered 2021-09-16: 40 mg via INTRA_ARTICULAR

## 2021-09-22 MED ORDER — BUPIVACAINE HCL 0.25 % IJ SOLN
5.0000 mL | INTRAMUSCULAR | Status: AC | PRN
  Administered 2021-09-16: 5 mL via INTRA_ARTICULAR

## 2021-11-07 ENCOUNTER — Ambulatory Visit: Payer: Medicare Other | Admitting: Orthopaedic Surgery

## 2022-02-12 ENCOUNTER — Telehealth: Payer: Self-pay | Admitting: Neurology

## 2022-02-12 NOTE — Telephone Encounter (Signed)
Pt is asking if a letter can be prepared for the housing authority stating she is now able to handle going up and down a few stairs and is open to a split level/two story home when she is eligible for one

## 2022-02-12 NOTE — Telephone Encounter (Signed)
We will monitor for a sooner appt.

## 2022-02-12 NOTE — Telephone Encounter (Signed)
Called pt. Scheduled her appointment for 4/2 @ 12:15pm. Pt was also added to wait list for sooner appointment

## 2022-02-26 ENCOUNTER — Ambulatory Visit: Payer: Medicare Other | Admitting: Neurology

## 2022-04-24 ENCOUNTER — Encounter: Payer: Self-pay | Admitting: Orthopaedic Surgery

## 2022-04-24 ENCOUNTER — Ambulatory Visit: Payer: Medicare Other | Admitting: Orthopaedic Surgery

## 2022-04-24 DIAGNOSIS — M65332 Trigger finger, left middle finger: Secondary | ICD-10-CM

## 2022-04-24 DIAGNOSIS — M65352 Trigger finger, left little finger: Secondary | ICD-10-CM

## 2022-04-24 DIAGNOSIS — M65342 Trigger finger, left ring finger: Secondary | ICD-10-CM

## 2022-04-24 MED ORDER — LIDOCAINE HCL 1 % IJ SOLN
0.3000 mL | INTRAMUSCULAR | Status: AC | PRN
  Administered 2022-04-24: .3 mL

## 2022-04-24 MED ORDER — BUPIVACAINE HCL 0.5 % IJ SOLN
0.3300 mL | INTRAMUSCULAR | Status: AC | PRN
  Administered 2022-04-24: .33 mL

## 2022-04-24 MED ORDER — METHYLPREDNISOLONE ACETATE 40 MG/ML IJ SUSP
13.3300 mg | INTRAMUSCULAR | Status: AC | PRN
  Administered 2022-04-24: 13.33 mg

## 2022-04-24 NOTE — Progress Notes (Signed)
Office Visit Note   Patient: Tonya Chaney           Date of Birth: [birth year]           MRN: [id number] Visit Date: 04/24/2022              Requested by: Tonya Oman, DO No address on file PCP: Tonya Oman, DO   Assessment & Plan: Visit Diagnoses:  1. Trigger finger, left middle finger   2. Trigger finger, left ring finger   3. Trigger finger, left little finger     Plan: Impression is left middle ring and small trigger fingers.  She would like to try steroid injections today.  These were performed for each finger.  She tolerated these well.  Follow-up as needed.  Follow-Up Instructions: No follow-ups on file.   Orders:  No orders of the defined types were placed in this encounter.  No orders of the defined types were placed in this encounter.     Procedures: Hand/UE Inj: L small A1 for trigger finger on 04/24/2022 2:47 PM Indications: pain Details: 25 G needle Medications: 0.3 mL lidocaine 1 %; 0.33 mL bupivacaine 0.5 %; 13.33 mg methylPREDNISolone acetate 40 MG/ML Outcome: tolerated well, no immediate complications Consent was given by the patient. Patient was prepped and draped in the usual sterile fashion.    Hand/UE Inj: L ring A1 for trigger finger on 04/24/2022 2:47 PM Indications: pain Details: 25 G needle Medications: 0.3 mL lidocaine 1 %; 0.33 mL bupivacaine 0.5 %; 13.33 mg methylPREDNISolone acetate 40 MG/ML Outcome: tolerated well, no immediate complications Consent was given by the patient. Patient was prepped and draped in the usual sterile fashion.    Hand/UE Inj: L long A1 for trigger finger on 04/24/2022 2:47 PM Indications: pain Details: 25 G needle Medications: 0.3 mL lidocaine 1 %; 0.33 mL bupivacaine 0.5 %; 13.33 mg methylPREDNISolone acetate 40 MG/ML Outcome: tolerated well, no immediate complications Consent was given by the patient. Patient was prepped and draped in the usual sterile fashion.       Clinical Data: No additional  findings.   Subjective: Chief Complaint  Patient presents with   Left Hand - Pain    middle, ring, and pinky finger    HPI  Tonya Chaney is a 77 year old female well-known to me comes in for painful locking left middle ring and small fingers.  This has been going on for 3 months.  Denies any injuries.  Right-hand-dominant.  She is diabetic.  Review of Systems   Objective: Vital Signs: There were no vitals taken for this visit.  Physical Exam  Ortho Exam  Examination of the left hand is consistent with locking of the long ring and small fingers.  She has tenderness in the palm overlying the A1 pulleys.  Specialty Comments:  No specialty comments available.  Imaging: No results found.   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  Past Surgical History:  Procedure Laterality Date   ABDOMINAL HYSTERECTOMY     CHOLECYSTECTOMY     COLONOSCOPY     LUMBAR LAMINECTOMY/DECOMPRESSION MICRODISCECTOMY Bilateral 05/01/2020   Procedure: Laminectomy and Foraminotomy - Lumbar Two-three, Lumbar Three-Four bilateral;  Surgeon: Eustace Moore,  MD;  Location: [facility];  Service: Neurosurgery;  Laterality: Bilateral;  posterior   ROTATOR CUFF REPAIR Right    Social History   Occupational History   Not on file  Tobacco Use   Smoking status: Never   Smokeless tobacco: Never  Vaping Use   Vaping Use: Never used  Substance and Sexual Activity   Alcohol use: No   Drug use: Never   Sexual activity: Not on file

## 2022-05-15 ENCOUNTER — Ambulatory Visit: Payer: 59 | Admitting: Orthopaedic Surgery

## 2022-05-26 ENCOUNTER — Ambulatory Visit: Payer: Medicare Other | Admitting: Neurology

## 2022-05-29 ENCOUNTER — Ambulatory Visit: Payer: Medicare Other | Admitting: Orthopaedic Surgery

## 2022-06-05 ENCOUNTER — Ambulatory Visit: Payer: Medicare Other | Admitting: Orthopaedic Surgery

## 2022-06-09 ENCOUNTER — Other Ambulatory Visit (INDEPENDENT_AMBULATORY_CARE_PROVIDER_SITE_OTHER): Payer: Medicare Other

## 2022-06-09 ENCOUNTER — Encounter: Payer: Self-pay | Admitting: Orthopaedic Surgery

## 2022-06-09 ENCOUNTER — Ambulatory Visit: Payer: Medicare Other | Admitting: Orthopaedic Surgery

## 2022-06-09 DIAGNOSIS — G8929 Other chronic pain: Secondary | ICD-10-CM | POA: Diagnosis not present

## 2022-06-09 DIAGNOSIS — M25561 Pain in right knee: Secondary | ICD-10-CM

## 2022-06-09 NOTE — Progress Notes (Signed)
Office Visit Note   Patient: Tonya Chaney           Date of Birth: 1945/07/16           MRN: [id number] Visit Date: 06/09/2022              Requested by: Yolanda Manges, DO No address on file PCP: Yolanda Manges, DO   Assessment & Plan: Visit Diagnoses:  1. Chronic pain of right knee     Plan: Impression is healed fracture of the lateral patella facet with aggravation of pre-existing OA.  Symptoms seem to be quite manageable.  She is not interested in steroid injection.  She will we will try Voltaren gel.  Activity as tolerated.  Follow-Up Instructions: No follow-ups on file.   Orders:  Orders Placed This Encounter  Procedures   XR KNEE 3 VIEW RIGHT   No orders of the defined types were placed in this encounter.     Procedures: No procedures performed   Clinical Data: No additional findings.   Subjective: Chief Complaint  Patient presents with   Right Knee - Pain    HPI  Tonya Chaney is a 77 year old female who is well-known to me comes in for right knee pain for about 5 months.  Had a fall initially directly onto the patella at that time.  Never saw a healthcare provider.  Endorses pain around the patella and towards the bottom of the knee.  Review of Systems   Objective: Vital Signs: There were no vitals taken for this visit.  Physical Exam  Ortho Exam  Examination right knee shows slight tenderness to the patella.  She has good range of motion without pain.  Normal patellar tracking.  1+ patellofemoral crepitus.  No joint effusion.  Collaterals and cruciates are stable.  Specialty Comments:  No specialty comments available.  Imaging: XR KNEE 3 VIEW RIGHT  Result Date: 06/09/2022 Three-view x-rays demonstrate a healed fracture of the lateral patellar facet.    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  Past Surgical History:  Procedure Laterality Date   ABDOMINAL HYSTERECTOMY     CHOLECYSTECTOMY     COLONOSCOPY     LUMBAR LAMINECTOMY/DECOMPRESSION MICRODISCECTOMY Bilateral 05/01/2020   Procedure: Laminectomy and Foraminotomy - Lumbar Two-three, Lumbar Three-Four bilateral;  Surgeon: Tia Alert, MD;  Location: [facility] OR;  Service: Neurosurgery;  Laterality: Bilateral;  posterior   ROTATOR CUFF REPAIR Right    Social History   Occupational History   Not on file  Tobacco Use   Smoking status: Never   Smokeless tobacco: Never  Vaping Use   Vaping Use: Never  used  Substance and Sexual Activity   Alcohol use: No   Drug use: Never   Sexual activity: Not on file
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
## 2022-08-13 ENCOUNTER — Ambulatory Visit: Payer: Medicare Other | Admitting: Neurology

## 2022-10-06 NOTE — Progress Notes (Deleted)
No chief complaint on file.  ASSESSMENT AND PLAN  Tonya Chaney is a 77 y.o. 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 at C7-T1, C5-6, C6-7 level with severe bilateral foraminal stenosis, there was no cord signal abnormality  Overall stable, no decline in ambulation, wishes to continue to observe, is not interested in surgery at this point, monitor for worsening gait, urinary/bowel incontinence   Peripheral neuropathy Gait abnormality  Length dependent, severe, axonal sensorimotor polyneuropathy, most consistent with her poorly controlled diabetes, previous A1c 11.5, most recent 9  Positive RPR titer, evaluated by infectious disease, treated with doxycycline  Have discussed the importance of better control of diabetes  Continue PT  Status post right L2-3, L3-4 hemilaminectomy, medial facetectomy, and foraminotomy in March 2022,  Symptom improved, gait is better  EMG nerve conduction study confirmed residual right L4-5 more than S1 chronic right lumbar radiculopathy  Monitor symptoms, continue to see PCP, return here as needed  MEDICAL HISTORY:  Tonya Chaney is a 77 year old female, seen in request by her primary care physician Dr. Andrey Campanile, Ranae Pila, for evaluation of bilateral feet paresthesia, initial evaluation was on November 28, 2020   I reviewed and summarized the referring note. 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 Marikay Alar  She reported significant improvement of her low back pain, radiating pain to  right leg, but continues to have persistent right leg numbness radiating from right lateral thigh into right lateral leg, to top of right foot, she felt right foot swelling,  Also complains of urinary urgency, occasional incontinence, continue have gait abnormality,  She also complains of neck pain, radiating pain to bilateral shoulder  UPDATE Feb 05 2021 Patient returns for evaluation of her worsening gait abnormality, lower extremity paresthesia, intermittent hands paresthesia  EMG nerve conduction study today which showed evidence of severe length dependent axonal sensorimotor polyneuropathy, also evidence of active neuropathic change involving right L4-5 more than S1 myotomes, consistent with her previous history of right lumbar radiculopathy  We also personally reviewed MRI of cervical spine in October 2022, multilevel degenerative changes, moderate to severe spinal stenosis at C7-T1, C6-7, C5-6, with severe bilateral foraminal stenosis, C4-5, mild spinal stenosis, with severe bilateral foraminal stenosis,  Update August 12, 2021 SS: RPR titer was positive 1:1, referred to infectious disease, completed 28-day course of doxycycline. Doing well getting around, no falls, with prolonged standing, right leg will feel numb. No paresthesia to hands. Urinary urgency MWF when takes diuretic. Feet are numb. Last A1C was 9.0.  Desires conservative management, does not want any medication for feet paresthesia.  Update October 07, 2022 SS:   PHYSICAL EXAM: There is no height or weight on file to calculate BMI.  Physical Exam  General: The patient is alert and cooperative at the time of the examination.  Skin: No significant  peripheral edema is noted.  Neurologic  Exam  Mental status: The patient is alert and oriented x 3 at the time of the examination. The patient has apparent normal recent and remote memory, with an apparently normal attention span and concentration ability.  Cranial nerves: Facial symmetry is present. Speech is normal, no aphasia or dysarthria is noted. Extraocular movements are full. Visual fields are full.  Motor: The patient has good strength in all 4 extremities, 4/5 bilateral hip flexion, weak dorsiflexion on the right  Sensory examination: Length dependent sensory deficit to soft touch  Coordination: The patient has good finger-nose-finger and heel-to-shin bilaterally.  Gait and station: Gait is wide-based, stiff, but independent  Reflexes: Deep tendon reflexes are symmetric, normal at the knees  REVIEW OF SYSTEMS:  Full 14 system review of systems performed and notable only for as above All other review of systems were negative.   ALLERGIES: Allergies  Allergen Reactions   Oxycodone Other (See Comments)    lethargey   Tramadol Anxiety   Tuna Flavor [Flavoring Agent] Diarrhea and Nausea And Vomiting   Penicillins Hives, Swelling and Rash    Reaction: patient was 77 years old    HOME MEDICATIONS: Current Outpatient Medications  Medication Sig Dispense Refill   albuterol (VENTOLIN HFA) 108 (90 Base) MCG/ACT inhaler Inhale 2 puffs into the lungs every 6 (six) hours as needed for wheezing or shortness of breath.     aspirin 81 MG chewable tablet Chew 81 mg by mouth daily.     azelastine (ASTELIN) 0.1 % nasal spray Place 2 sprays into both nostrils daily.     B-D ULTRAFINE III SHORT PEN 31G X 8 MM MISC Inject into the skin 2 (two) times daily.     cholecalciferol (VITAMIN D) 25 MCG (1000 UNIT) tablet Take 1,000 Units by mouth daily.     DEXILANT 60 MG capsule Take 1 capsule by mouth daily.     FEROSUL 325 (65 Fe) MG tablet Take 325 mg by mouth at bedtime.     fluticasone  (FLONASE) 50 MCG/ACT nasal spray Place 2 sprays into both nostrils at bedtime.     furosemide (LASIX) 20 MG tablet Take by mouth.     HUMALOG MIX 75/25 KWIKPEN (75-25) 100 UNIT/ML Kwikpen Inject 20-50 Units into the skin See admin instructions. Take 50 units in the morning 20 units at noon and 30 units at bedtime     Lancets (ONETOUCH DELICA PLUS LANCET33G) MISC Apply 1 each topically 4 (four) times daily.     lidocaine (LIDODERM) 5 % Place 1 patch onto the skin daily. Remove & Discard patch within 12 hours or as directed by MD 30 patch 0   losartan (COZAAR) 50 MG tablet Take 75 mg by mouth daily.     Magnesium 400 MG CAPS Take by mouth.     meclizine (ANTIVERT) 25 MG tablet Take 25 mg by mouth 3 (three) times daily as needed for dizziness.     metFORMIN (GLUCOPHAGE-XR) 750 MG 24 hr tablet Take 750 mg by mouth 2 (two) times daily.     methocarbamol (ROBAXIN) 500 MG tablet Take 1 tablet (500 mg total) by mouth 2 (two) times daily. 10 tablet 0   montelukast (SINGULAIR) 10 MG tablet Take 10 mg by mouth at bedtime.     ONETOUCH VERIO test strip 4 (four) times daily.     potassium chloride (KLOR-CON) 10 MEQ tablet Take 10 mEq by mouth daily.     rosuvastatin (CRESTOR) 5 MG tablet Take 5 mg by mouth daily.  SYMBICORT 160-4.5 MCG/ACT inhaler Inhale 2 puffs into the lungs 2 (two) times daily.     vitamin B-12 (CYANOCOBALAMIN) 1000 MCG tablet Take 1,000 mcg by mouth daily.     No current facility-administered medications for this visit.    PAST MEDICAL HISTORY: Past Medical History:  Diagnosis Date   Allergies    Arthritis    Diabetes mellitus without complication (HCC)    GERD (gastroesophageal reflux disease)    Hypertension    Vertigo    04/30/20- not in a couple of years    PAST SURGICAL HISTORY: Past Surgical History:  Procedure Laterality Date   ABDOMINAL HYSTERECTOMY     CHOLECYSTECTOMY     COLONOSCOPY     LUMBAR LAMINECTOMY/DECOMPRESSION MICRODISCECTOMY Bilateral 05/01/2020    Procedure: Laminectomy and Foraminotomy - Lumbar Two-three, Lumbar Three-Four bilateral;  Surgeon: Tia Alert, MD;  Location: [facility] OR;  Service: Neurosurgery;  Laterality: Bilateral;  posterior   ROTATOR CUFF REPAIR Right     FAMILY HISTORY: No family history on file.  SOCIAL HISTORY: Social History   Socioeconomic History   Marital status: Widowed    Spouse name: Not on file   Number of children: Not on file   Years of education: Not on file   Highest education level: Not on file  Occupational History   Not on file  Tobacco Use   Smoking status: Never   Smokeless tobacco: Never  Vaping Use   Vaping status: Never Used  Substance and Sexual Activity   Alcohol use: No   Drug use: Never   Sexual activity: Not on file  Other Topics Concern   Not on file  Social History Narrative   Lives with 2 great grandchildre   Right Handed   Drinks 1-2 cups caffeine   Social Determinants of Health   Financial Resource Strain: Low Risk  (04/09/2021)   [facility], [facility] prior to 04/25/2022[facility] visits prior to 04/25/2022.   Overall Financial Resource Strain (CARDIA)    Difficulty of Paying Living Expenses: Not very hard  Food Insecurity: Low Risk  (09/09/2022)   [facility]   Food vital sign    Within the past 12 months, you worried that your food would run out before you got money to buy more: Never true    Within the past 12 months, the food you bought just didn't last and you didn't have money to get more. : Never true  Transportation Needs: Not on file (09/09/2022)  Physical Activity: Inactive (04/09/2021)   [facility] prior to 04/25/2022[facility] visits prior to 04/25/2022.   Exercise Vital Sign    Days of Exercise per Week: 0 days    Minutes of Exercise per Session: 0 min   Stress: No Stress Concern Present (04/09/2021)   [facility] prior to 04/25/2022., [facility] visits prior to 04/25/2022.   [employer] of Occupational Health - Occupational Stress Questionnaire    Feeling of Stress : Not at all  Social Connections: Socially Isolated (04/09/2021)   [facility] prior to 04/25/2022., [facility] visits prior to 04/25/2022.   Social Connection and Isolation Panel [NHANES]    Frequency of Communication with Friends and  Family: More than three times a week    Frequency of Social Gatherings with Friends and Family: More than three times a week    Attends Religious Services: Never    Database administrator or Organizations: No    Attends Banker Meetings: Never    Marital Status: Widowed  Intimate Partner Violence: Not At Risk (04/09/2021)   Received from [facility] visits prior to 04/25/2022., [facility] visits prior to 04/25/2022.   Humiliation, Afraid, Rape, and Kick questionnaire    Fear of Current or Ex-Partner: No    Emotionally Abused: No    Physically Abused: No    Sexually Abused: No    Otila Kluver, DNP  [facility] [address], [address] [place], [place] [address] [phone]

## 2022-10-07 ENCOUNTER — Ambulatory Visit: Payer: Medicare Other | Admitting: Neurology

## 2023-02-18 ENCOUNTER — Ambulatory Visit: Payer: Medicare Other | Admitting: Neurology

## 2023-03-02 ENCOUNTER — Telehealth: Payer: Self-pay | Admitting: Neurology

## 2023-03-02 NOTE — Progress Notes (Deleted)
 No chief complaint on file.  ASSESSMENT AND PLAN  Tonya Chaney is a 78 y.o. 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 at C7-T1, C5-6, C6-7 level with severe bilateral foraminal stenosis, there was no cord signal abnormality  Overall stable, no decline in ambulation, wishes to continue to observe, is not interested in surgery at this point, monitor for worsening gait, urinary/bowel incontinence   Peripheral neuropathy Gait abnormality  Length dependent, severe, axonal sensorimotor polyneuropathy, most consistent with her poorly controlled diabetes, previous A1c 11.5, most recent 9  Positive RPR titer, evaluated by infectious disease, treated with doxycycline   Have discussed the importance of better control of diabetes  Continue PT  Status post right L2-3, L3-4 hemilaminectomy, medial facetectomy, and foraminotomy in March 2022,  Symptom improved, gait is better  EMG nerve conduction study confirmed residual right L4-5 more than S1 chronic right lumbar radiculopathy  Monitor symptoms, continue to see PCP, return here as needed  MEDICAL HISTORY:  Tonya Chaney is a 78 year old female, seen in request by her primary care physician Dr. Tanda, Marolyn HERO, for evaluation of bilateral feet paresthesia, initial evaluation was on November 28, 2020   I reviewed and summarized the referring note. 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 Alm Molt  She reported significant improvement of her low back pain, radiating pain to  right leg, but continues to have persistent right leg numbness radiating from right lateral thigh into right lateral leg, to top of right foot, she felt right foot swelling,  Also complains of urinary urgency, occasional incontinence, continue have gait abnormality,  She also complains of neck pain, radiating pain to bilateral shoulder  UPDATE Feb 05 2021 Patient returns for evaluation of her worsening gait abnormality, lower extremity paresthesia, intermittent hands paresthesia  EMG nerve conduction study today which showed evidence of severe length dependent axonal sensorimotor polyneuropathy, also evidence of active neuropathic change involving right L4-5 more than S1 myotomes, consistent with her previous history of right lumbar radiculopathy  We also personally reviewed MRI of cervical spine in October 2022, multilevel degenerative changes, moderate to severe spinal stenosis at C7-T1, C6-7, C5-6, with severe bilateral foraminal stenosis, C4-5, mild spinal stenosis, with severe bilateral foraminal stenosis,  Update August 12, 2021 SS: RPR titer was positive 1:1, referred to infectious disease, completed 28-day course of doxycycline . Doing well getting around, no falls, with prolonged standing, right leg will feel numb. No paresthesia to hands. Urinary urgency MWF when takes diuretic. Feet are numb. Last A1C was 9.0.  Desires conservative management, does not want any medication for feet paresthesia.  Update March 03, 2023 SS:   PHYSICAL EXAM: There is no height or weight on file to calculate BMI.  Physical Exam  General: The patient is alert and cooperative at the time of the examination.  Skin: No significant  peripheral edema is noted.  Neurologic  Exam  Mental status: The patient is alert and oriented x 3 at the time of the examination. The patient has apparent normal recent and remote memory, with an apparently normal attention span and concentration ability.  Cranial nerves: Facial symmetry is present. Speech is normal, no aphasia or dysarthria is noted. Extraocular movements are full. Visual fields are full.  Motor: The patient has good strength in all 4 extremities, 4/5 bilateral hip flexion, weak dorsiflexion on the right  Sensory examination: Length dependent sensory deficit to soft touch  Coordination: The patient has good finger-nose-finger and heel-to-shin bilaterally.  Gait and station: Gait is wide-based, stiff, but independent  Reflexes: Deep tendon reflexes are symmetric, normal at the knees  REVIEW OF SYSTEMS:  Full 14 system review of systems performed and notable only for as above All other review of systems were negative.   ALLERGIES: Allergies  Allergen Reactions   Oxycodone  Other (See Comments)    lethargey   Tramadol Anxiety   Tuna Flavor [Flavoring Agent] Diarrhea and Nausea And Vomiting   Penicillins Hives, Swelling and Rash    Reaction: patient was 78 years old    HOME MEDICATIONS: Current Outpatient Medications  Medication Sig Dispense Refill   albuterol  (VENTOLIN  HFA) 108 (90 Base) MCG/ACT inhaler Inhale 2 puffs into the lungs every 6 (six) hours as needed for wheezing or shortness of breath.     aspirin 81 MG chewable tablet Chew 81 mg by mouth daily.     azelastine  (ASTELIN ) 0.1 % nasal spray Place 2 sprays into both nostrils daily.     B-D ULTRAFINE III SHORT PEN 31G X 8 MM MISC Inject into the skin 2 (two) times daily.     cholecalciferol (VITAMIN D) 25 MCG (1000 UNIT) tablet Take 1,000 Units by mouth daily.     DEXILANT 60 MG capsule Take 1 capsule by mouth daily.     FEROSUL 325 (65 Fe) MG tablet Take 325 mg by mouth at bedtime.     fluticasone  (FLONASE) 50 MCG/ACT nasal spray Place 2 sprays into both nostrils at bedtime.     furosemide (LASIX) 20 MG tablet Take by mouth.     HUMALOG MIX 75/25 KWIKPEN (75-25) 100 UNIT/ML Kwikpen Inject 20-50 Units into the skin See admin instructions. Take 50 units in the morning 20 units at noon and 30 units at bedtime     Lancets (ONETOUCH DELICA PLUS LANCET33G) MISC Apply 1 each topically 4 (four) times daily.     lidocaine  (LIDODERM ) 5 % Place 1 patch onto the skin daily. Remove & Discard patch within 12 hours or as directed by MD 30 patch 0   losartan  (COZAAR ) 50 MG tablet Take 75 mg by mouth daily.     Magnesium 400 MG CAPS Take by mouth.     meclizine (ANTIVERT) 25 MG tablet Take 25 mg by mouth 3 (three) times daily as needed for dizziness.     metFORMIN  (GLUCOPHAGE -XR) 750 MG 24 hr tablet Take 750 mg by mouth 2 (two) times daily.     methocarbamol  (ROBAXIN ) 500 MG tablet Take 1 tablet (500 mg total) by mouth 2 (two) times daily. 10 tablet 0   montelukast  (SINGULAIR ) 10 MG tablet Take 10 mg by mouth at bedtime.     ONETOUCH VERIO test strip 4 (four) times daily.     potassium chloride  (KLOR-CON ) 10 MEQ tablet Take 10 mEq by mouth daily.     rosuvastatin (CRESTOR) 5 MG tablet Take 5 mg by mouth daily.  SYMBICORT 160-4.5 MCG/ACT inhaler Inhale 2 puffs into the lungs 2 (two) times daily.     vitamin B-12 (CYANOCOBALAMIN) 1000 MCG tablet Take 1,000 mcg by mouth daily.     No current facility-administered medications for this visit.    PAST MEDICAL HISTORY: Past Medical History:  Diagnosis Date   Allergies    Arthritis    Diabetes mellitus without complication (HCC)    GERD (gastroesophageal reflux disease)    Hypertension    Vertigo    04/30/20- not in a couple of years    PAST SURGICAL HISTORY: Past Surgical History:  Procedure Laterality Date   ABDOMINAL HYSTERECTOMY     CHOLECYSTECTOMY     COLONOSCOPY     LUMBAR LAMINECTOMY/DECOMPRESSION MICRODISCECTOMY Bilateral 05/01/2020    Procedure: Laminectomy and Foraminotomy - Lumbar Two-three, Lumbar Three-Four bilateral;  Surgeon: Joshua Alm RAMAN, MD;  Location: [facility] OR;  Service: Neurosurgery;  Laterality: Bilateral;  posterior   ROTATOR CUFF REPAIR Right     FAMILY HISTORY: No family history on file.  SOCIAL HISTORY: Social History   Socioeconomic History   Marital status: Widowed    Spouse name: Not on file   Number of children: Not on file   Years of education: Not on file   Highest education level: Not on file  Occupational History   Not on file  Tobacco Use   Smoking status: Never   Smokeless tobacco: Never  Vaping Use   Vaping status: Never Used  Substance and Sexual Activity   Alcohol use: No   Drug use: Never   Sexual activity: Not on file  Other Topics Concern   Not on file  Social History Narrative   Lives with 2 great grandchildre   Right Handed   Drinks 1-2 cups caffeine   Social Drivers of Health   Financial Resource Strain: Low Risk  (04/09/2021)   [facility] prior to 04/25/2022., [facility] visits prior to 04/25/2022.   Overall Financial Resource Strain (CARDIA)    Difficulty of Paying Living Expenses: Not very hard  Food Insecurity: Low Risk  (02/11/2023)   [facility]   Hunger Vital Sign    Worried About Running Out of Food in the Last Year: Never true    Ran Out of Food in the Last Year: Never true  Transportation Needs: No Transportation Needs (02/11/2023)   Received from [facility]    In the past 12 months, has lack of reliable transportation kept you from medical appointments, meetings, work or from getting things needed for daily living? : No  Physical Activity: Inactive (04/09/2021)   [facility] visits prior to 04/25/2022., [facility] visits prior to 04/25/2022.    Exercise Vital Sign    Days of Exercise per Week: 0 days    Minutes of Exercise per Session: 0 min  Stress: No Stress Concern Present (04/09/2021)   [facility] visits prior to 04/25/2022., [facility] visits prior to 04/25/2022.   [employer] of Occupational Health - Occupational Stress Questionnaire    Feeling of Stress : Not at all  Social Connections: Socially Isolated (04/09/2021)   [facility] visits prior to 04/25/2022., [facility] visits prior to 04/25/2022.  Social Advertising Account Executive [NHANES]    Frequency of Communication with Friends and Family: More than three times a week    Frequency of Social Gatherings with Friends and Family: More than three times a week    Attends Religious Services: Never    Database Administrator or Organizations: No    Attends Banker Meetings: Never    Marital Status: Widowed  Intimate Partner Violence: Not At Risk (04/09/2021)   Received from [facility] visits prior to 04/25/2022., [facility] visits prior to 04/25/2022.   Humiliation, Afraid, Rape, and Kick questionnaire    Fear of Current or Ex-Partner: No    Emotionally Abused: No    Physically Abused: No    Sexually Abused: No    Lauraine Gayland MANDES, DNP  [facility] [address] [place], [place] [address] [phone]

## 2023-03-02 NOTE — Telephone Encounter (Signed)
 Pt r/s appt due to time conflict. Wait lsted

## 2023-03-03 ENCOUNTER — Ambulatory Visit: Payer: Medicare Other | Admitting: Neurology

## 2023-03-26 ENCOUNTER — Encounter (INDEPENDENT_AMBULATORY_CARE_PROVIDER_SITE_OTHER): Payer: Self-pay

## 2023-04-13 ENCOUNTER — Ambulatory Visit: Payer: Medicare Other | Admitting: Neurology

## 2023-06-15 ENCOUNTER — Ambulatory Visit: Admitting: Orthopaedic Surgery

## 2023-07-14 ENCOUNTER — Ambulatory Visit: Admitting: Orthopaedic Surgery

## 2023-07-14 ENCOUNTER — Encounter: Payer: Self-pay | Admitting: Orthopaedic Surgery

## 2023-07-14 DIAGNOSIS — M5412 Radiculopathy, cervical region: Secondary | ICD-10-CM | POA: Diagnosis not present

## 2023-07-14 DIAGNOSIS — M1711 Unilateral primary osteoarthritis, right knee: Secondary | ICD-10-CM

## 2023-07-14 MED ORDER — LIDOCAINE HCL 1 % IJ SOLN
2.0000 mL | INTRAMUSCULAR | Status: AC | PRN
  Administered 2023-07-14: 2 mL

## 2023-07-14 MED ORDER — TIZANIDINE HCL 4 MG PO TABS: 2.0000 mg | ORAL_TABLET | Freq: Three times a day (TID) | ORAL | 2 refills | Status: AC | PRN

## 2023-07-14 MED ORDER — PREDNISONE 10 MG (21) PO TBPK: ORAL_TABLET | ORAL | 3 refills | Status: DC

## 2023-07-14 MED ORDER — METHYLPREDNISOLONE ACETATE 40 MG/ML IJ SUSP
40.0000 mg | INTRAMUSCULAR | Status: AC | PRN
  Administered 2023-07-14: 40 mg via INTRA_ARTICULAR

## 2023-07-14 MED ORDER — BUPIVACAINE HCL 0.5 % IJ SOLN
2.0000 mL | INTRAMUSCULAR | Status: AC | PRN
  Administered 2023-07-14: 2 mL via INTRA_ARTICULAR

## 2023-07-14 NOTE — Progress Notes (Signed)
 Office Visit Note   Patient: Tonya Chaney           Date of Birth: 06-20-1945           MRN: [id number] Visit Date: 07/14/2023              Requested by: Marjean Sierras, DO No address on file PCP: Marjean Sierras, DO   Assessment & Plan: Visit Diagnoses:  1. Cervical radiculopathy   2. Primary osteoarthritis of right knee     Plan: History of Present Illness Tonya Chaney is a 78 year old female who presents with severe neck and shoulder pain radiating to her arm.  Severe neck and shoulder pain has been present since Saturday, radiating from the shoulder up behind the ear and down the arm. The pain significantly impacts her daily activities, including caring for her grandchildren. An anti-inflammatory injection in the emergency room provided partial relief. There is no burning pain, numbness, or tingling in the arm.  CT scan performed in the ED of the [facility] which showed diffuse degenerative changes without acute abnormalities.  This was performed at a [facility] facility.  She takes gabapentin  300 mg three times daily. She also has ongoing right knee pain with previous imaging, including x-rays.    Physical Exam NECK: Tenderness all around, including muscles and behind the ear. Extension and rotation cause pain radiating down the arm.  Positive Spurling sign.  No motor weakness.  No sensory deficits. MUSCULOSKELETAL: Good shoulder abduction and strength. Right knee with good motion, lateral joint line tenderness, no effusion.  RADIOLOGY Right knee x-ray: Arthritic changes CT [facility]: Degenerative arthritis, no acute findings  Assessment and Plan Cervical radiculopathy Cervical radiculopathy with severe pain radiating from neck to arm. Positive Spurling's sign suggests nerve compression. Pain persists post anti-inflammatory injection. - Prescribe prednisone  for inflammation. - Prescribe muscle relaxant for muscle tension. - Continue gabapentin  300 mg TID for  neuropathic pain. - Advise caution with muscle relaxants due to drowsiness; suggest evening administration or starting with half a dose.  Degenerative arthritis of the right knee Chronic degenerative arthritis with lateral joint line tenderness. Imaging shows significant degenerative changes. - Administer steroid injection to the right knee for inflammation and pain reduction.  Follow-Up Instructions: No follow-ups on file.   Orders:  Orders Placed This Encounter  Procedures   Large Joint Inj: R knee   Meds ordered this encounter  Medications   predniSONE  (STERAPRED UNI-PAK 21 TAB) 10 MG (21) TBPK tablet    Sig: Take as directed    Dispense:  21 tablet    Refill:  3   tiZANidine  (ZANAFLEX ) 4 MG tablet    Sig: Take 0.5-1 tablets (2-4 mg total) by mouth every 8 (eight) hours as needed for muscle spasms.    Dispense:  30 tablet    Refill:  2      Procedures: Large Joint Inj: R knee on 07/14/2023 2:57 PM Indications: pain Details: 22 G needle  Arthrogram: No  Medications: 40 mg methylPREDNISolone  acetate 40 MG/ML; 2 mL lidocaine  1 %; 2 mL bupivacaine  0.5 % Consent was given by the patient. Patient was prepped and draped in the usual sterile fashion.       Clinical Data: No additional findings.   Subjective: Chief Complaint  Patient presents with   Right Knee - Pain   Neck - Pain    HPI  Review of Systems  Constitutional: Negative.   HENT: Negative.    Eyes: Negative.  Respiratory: Negative.    Cardiovascular: Negative.   Endocrine: Negative.   Musculoskeletal: Negative.   Neurological: Negative.   Hematological: Negative.   Psychiatric/Behavioral: Negative.    All other systems reviewed and are negative.    Objective: Vital Signs: There were no vitals taken for this visit.  Physical Exam Vitals and nursing note reviewed.  Constitutional:      Appearance: She is well-developed.  HENT:     Head: Atraumatic.     Nose: Nose normal.  Eyes:      Extraocular Movements: Extraocular movements intact.  Cardiovascular:     Pulses: Normal pulses.  Pulmonary:     Effort: Pulmonary effort is normal.  Abdominal:     Palpations: Abdomen is soft.  Musculoskeletal:     Cervical back: Neck supple.  Skin:    General: Skin is warm.     Capillary Refill: Capillary refill takes less than 2 seconds.  Neurological:     Mental Status: She is alert. Mental status is at baseline.  Psychiatric:        Behavior: Behavior normal.        Thought Content: Thought content normal.        Judgment: Judgment normal.     Ortho Exam  Specialty Comments:  No specialty comments available.  Imaging: No results found.   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  Past Surgical History:  Procedure Laterality Date   ABDOMINAL HYSTERECTOMY     CHOLECYSTECTOMY     COLONOSCOPY     LUMBAR LAMINECTOMY/DECOMPRESSION MICRODISCECTOMY Bilateral 05/01/2020   Procedure: Laminectomy and Foraminotomy - Lumbar Two-three, Lumbar Three-Four bilateral;  Surgeon: Isadora Mar, MD;  Location: [facility] OR;  Service: Neurosurgery;   Laterality: Bilateral;  posterior   ROTATOR CUFF REPAIR Right    Social History   Occupational History   Not on file  Tobacco Use   Smoking status: Never   Smokeless tobacco: Never  Vaping Use   Vaping status: Never Used  Substance and Sexual Activity   Alcohol use: No   Drug use: Never   Sexual activity: Not on file

## 2023-08-30 ENCOUNTER — Telehealth: Payer: Self-pay | Admitting: Neurology

## 2023-08-30 NOTE — Telephone Encounter (Signed)
 Appointment details confirmed

## 2023-09-09 ENCOUNTER — Ambulatory Visit: Payer: Medicare Other | Admitting: Neurology

## 2023-09-09 ENCOUNTER — Encounter: Payer: Self-pay | Admitting: Neurology

## 2023-09-09 VITALS — BP 140/81 | HR 76 | Resp 17 | Ht 65.0 in

## 2023-09-09 DIAGNOSIS — M5412 Radiculopathy, cervical region: Secondary | ICD-10-CM

## 2023-09-09 DIAGNOSIS — G6289 Other specified polyneuropathies: Secondary | ICD-10-CM

## 2023-09-09 NOTE — Patient Instructions (Signed)
 Nice to see you today.  Please continue follow-up with primary care and orthopedics.  Please call for worsening symptoms.  Recommend good control of your diabetes.  Follow-up at our office as needed.  Thanks!!

## 2023-09-09 NOTE — Progress Notes (Signed)
 No chief complaint on file.  ASSESSMENT AND PLAN  Tonya Chaney is a 78 y.o. 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 at C7-T1, C5-6, C6-7 level with severe bilateral foraminal stenosis, there was no cord signal abnormality  Stable, doesn't desire surgery, follows with orthopedics, takes tizanidine  PRN   Peripheral neuropathy Gait abnormality  Length dependent, severe, axonal sensorimotor polyneuropathy, most consistent with diabetes, A1C 8.2, continue good control of DM   Positive RPR titer, evaluated by infectious disease, treated with doxycycline    Status post right L2-3, L3-4 hemilaminectomy, medial facetectomy, and foraminotomy in March 2022,  EMG nerve conduction study confirmed residual right L4-5 more than S1 chronic right lumbar radiculopathy  Monitor symptoms, continue to see PCP, return here as needed  MEDICAL HISTORY:  Tonya Chaney is a 78 year old female, seen in request by her primary care physician Dr. Tanda, Marolyn HERO, for evaluation of bilateral feet paresthesia, initial evaluation was on November 28, 2020   I reviewed and summarized the referring note. PMHx HLD DM since 1989 HTN   Patient reported a long history of right lumbar radiculopathy, gradually getting worse, to the point, she has significant difficulty walking  I personally reviewed MRI of lumbar in February 2022, severe multifactorial spinal stenosis L3-4, moderate neuroforaminal stenosis, moderate spinal stenosis at L2-3, neuroforaminal stenosis, chronic moderate to severe right neuroforaminal stenosis at L5-S1  MRI righ thip in Feb 2022: Mild femoroacetabular joint osteoarthritis with anterior superior labral degeneration  Partial insertional tear of the right hamstrings tendon with tendinosis   She had right L2-3, L3-4 hemilaminectomy medial facetectomy and foraminotomy followed by sublaminar decompression in March 2022 by Dr. Alm Molt  She reported  significant improvement of her low back pain, radiating pain to  right leg, but continues to have persistent right leg numbness radiating from right lateral thigh into right lateral leg, to top of right foot, she felt right foot swelling,  Also complains of urinary urgency, occasional incontinence, continue have gait abnormality,  She also complains of neck pain, radiating pain to bilateral shoulder  UPDATE Feb 05 2021 Patient returns for evaluation of her worsening gait abnormality, lower extremity paresthesia, intermittent hands paresthesia  EMG nerve conduction study today which showed evidence of severe length dependent axonal sensorimotor polyneuropathy, also evidence of active neuropathic change involving right L4-5 more than S1 myotomes, consistent with her previous history of right lumbar radiculopathy  We also personally reviewed MRI of cervical spine in October 2022, multilevel degenerative changes, moderate to severe spinal stenosis at C7-T1, C6-7, C5-6, with severe bilateral foraminal stenosis, C4-5, mild spinal stenosis, with severe bilateral foraminal stenosis,  Update August 12, 2021 SS: RPR titer was positive 1:1, referred to infectious disease, completed 28-day course of doxycycline . Doing well getting around, no falls, with prolonged standing, right leg will feel numb. No paresthesia to hands. Urinary urgency MWF when takes diuretic. Feet are numb. Last A1C was 9.0.  Desires conservative management, does not want any medication for feet paresthesia.  Update September 09, 2023 SS: Saw Ortho in May for cervical radiculopathy given prednisone , muscle relaxant, mostly in left shoulder radiating into base of neck on left. Doesn't want surgery. Balance is fine, has urinary incontinence from aging. Has numbness in her feet, no falls, DM much better A1C 8.2. has arthritis in her hands. She custody of 2 grandchildren.   PHYSICAL EXAM: There is no height or weight on file to calculate  BMI.  Physical Exam  General: The  patient is alert and cooperative at the time of the examination.  Skin: No significant peripheral edema is noted.  Neurologic Exam  Mental status: The patient is alert and oriented x 3 at the time of the examination. The patient has apparent normal recent and remote memory, with an apparently normal attention span and concentration ability.  Cranial nerves: Facial symmetry is present. Speech is normal, no aphasia or dysarthria is noted. Extraocular movements are full. Visual fields are full.  Motor: The patient has good strength in all 4 extremities, 4/5 bilateral hip flexion  Sensory examination: Length dependent sensory deficit to soft touch  Coordination: The patient has good finger-nose-finger and heel-to-shin bilaterally.  Gait and station: Gait is wide-based, stiff, but independent  Reflexes: Deep tendon reflexes are symmetric, normal at the knees  REVIEW OF SYSTEMS:  Full 14 system review of systems performed and notable only for as above All other review of systems were negative.   ALLERGIES: Allergies  Allergen Reactions   Oxycodone  Other (See Comments)    lethargey   Tramadol Anxiety   Environmental consultant Agent (Non-Screening)] Diarrhea and Nausea And Vomiting   Penicillins Hives, Swelling and Rash    Reaction: patient was 78 years old    HOME MEDICATIONS: Current Outpatient Medications  Medication Sig Dispense Refill   albuterol  (VENTOLIN  HFA) 108 (90 Base) MCG/ACT inhaler Inhale 2 puffs into the lungs every 6 (six) hours as needed for wheezing or shortness of breath.     aspirin 81 MG chewable tablet Chew 81 mg by mouth daily.     azelastine  (ASTELIN ) 0.1 % nasal spray Place 2 sprays into both nostrils daily.     B-D ULTRAFINE III SHORT PEN 31G X 8 MM MISC Inject into the skin 2 (two) times daily.     cholecalciferol (VITAMIN D) 25 MCG (1000 UNIT) tablet Take 1,000 Units by mouth daily.     DEXILANT 60 MG capsule Take 1  capsule by mouth daily.     FEROSUL 325 (65 Fe) MG tablet Take 325 mg by mouth at bedtime.     fluticasone (FLONASE) 50 MCG/ACT nasal spray Place 2 sprays into both nostrils at bedtime.     furosemide (LASIX) 20 MG tablet Take by mouth.     HUMALOG MIX 75/25 KWIKPEN (75-25) 100 UNIT/ML Kwikpen Inject 20-50 Units into the skin See admin instructions. Take 50 units in the morning 20 units at noon and 30 units at bedtime     Lancets (ONETOUCH DELICA PLUS LANCET33G) MISC Apply 1 each topically 4 (four) times daily.     lidocaine  (LIDODERM ) 5 % Place 1 patch onto the skin daily. Remove & Discard patch within 12 hours or as directed by MD 30 patch 0   losartan  (COZAAR ) 50 MG tablet Take 75 mg by mouth daily.     Magnesium 400 MG CAPS Take by mouth.     meclizine (ANTIVERT) 25 MG tablet Take 25 mg by mouth 3 (three) times daily as needed for dizziness.     metFORMIN  (GLUCOPHAGE -XR) 750 MG 24 hr tablet Take 750 mg by mouth 2 (two) times daily.     methocarbamol  (ROBAXIN ) 500 MG tablet Take 1 tablet (500 mg total) by mouth 2 (two) times daily. 10 tablet 0   montelukast  (SINGULAIR ) 10 MG tablet Take 10 mg by mouth at bedtime.     ONETOUCH VERIO test strip 4 (four) times daily.     potassium chloride  (KLOR-CON ) 10 MEQ tablet Take 10 mEq by  mouth daily.     predniSONE  (STERAPRED UNI-PAK 21 TAB) 10 MG (21) TBPK tablet Take as directed 21 tablet 3   rosuvastatin (CRESTOR) 5 MG tablet Take 5 mg by mouth daily.     SYMBICORT 160-4.5 MCG/ACT inhaler Inhale 2 puffs into the lungs 2 (two) times daily.     tiZANidine  (ZANAFLEX ) 4 MG tablet Take 0.5-1 tablets (2-4 mg total) by mouth every 8 (eight) hours as needed for muscle spasms. 30 tablet 2   vitamin B-12 (CYANOCOBALAMIN) 1000 MCG tablet Take 1,000 mcg by mouth daily.     No current facility-administered medications for this visit.    PAST MEDICAL HISTORY: Past Medical History:  Diagnosis Date   Allergies    Arthritis    Diabetes mellitus without  complication (HCC)    GERD (gastroesophageal reflux disease)    Hypertension    Vertigo    04/30/20- not in a couple of years    PAST SURGICAL HISTORY: Past Surgical History:  Procedure Laterality Date   ABDOMINAL HYSTERECTOMY     CHOLECYSTECTOMY     COLONOSCOPY     LUMBAR LAMINECTOMY/DECOMPRESSION MICRODISCECTOMY Bilateral 05/01/2020   Procedure: Laminectomy and Foraminotomy - Lumbar Two-three, Lumbar Three-Four bilateral;  Surgeon: Joshua Alm RAMAN, MD;  Location: [facility] OR;  Service: Neurosurgery;  Laterality: Bilateral;  posterior   ROTATOR CUFF REPAIR Right     FAMILY HISTORY: No family history on file.  SOCIAL HISTORY: Social History   Socioeconomic History   Marital status: Widowed    Spouse name: Not on file   Number of children: Not on file   Years of education: Not on file   Highest education level: Not on file  Occupational History   Not on file  Tobacco Use   Smoking status: Never   Smokeless tobacco: Never  Vaping Use   Vaping status: Never Used  Substance and Sexual Activity   Alcohol use: No   Drug use: Never   Sexual activity: Not on file  Other Topics Concern   Not on file  Social History Narrative   Lives with 2 great grandchildre   Right Handed   Drinks 1-2 cups caffeine   Social Drivers of Health   Financial Resource Strain: Low Risk  (04/09/2021)   Received from [facility] visits prior to 04/25/2022.   Overall Financial Resource Strain (CARDIA)    Difficulty of Paying Living Expenses: Not very hard  Food Insecurity: Low Risk  (06/18/2023)   Received from [facility]   Hunger Vital Sign    Within the past 12 months, you worried that your food would run out before you got money to buy more: Never true    Within the past 12 months, the food you bought just didn't last and you didn't have money to get more. : Never true  Transportation Needs: No Transportation Needs (06/18/2023)   Received from [employer]    In the past 12 months, has lack of reliable transportation kept you from medical appointments, meetings, work or from getting things needed for daily living? : No  Physical Activity: Inactive (04/09/2021)   [facility] visits prior to 04/25/2022., [facility]   Exercise Vital Sign    On average, how many days per week do you engage in moderate to strenuous exercise (like a brisk walk)?: 0 days    On average, how many minutes do you engage in exercise at this level?:  0 min  Stress: No Stress Concern Present (04/09/2021)   Received from [facility] visits prior to 04/25/2022., [facility]   [employer] of Occupational Health - Occupational Stress Questionnaire    Feeling of Stress : Not at all  Social Connections: Socially Isolated (04/09/2021)   [facility] visits prior to 04/25/2022., [facility]   Social Connection and Isolation Panel    In a typical week, how many times do you talk on the phone with family, friends, or neighbors?: More than three times a week    How often do you get together with friends or relatives?: More than three times a week    How often do you attend church or religious services?: Never    Do you belong to any clubs or organizations such as church groups, unions, fraternal or athletic groups, or school groups?: No    How often do you attend meetings of the clubs or organizations you belong to?: Never    Are you married, widowed, divorced, separated, never married, or living with a partner?: Widowed  Intimate Partner Violence: Not At Risk (04/09/2021)   [facility] from [facility] visits prior to 04/25/2022.   Humiliation, Afraid, Rape, and Kick questionnaire    Fear of Current or Ex-Partner: No    Emotionally Abused: No    Physically Abused: No    Sexually Abused: No    Lauraine Gayland MANDES, DNP  [facility] [address] [place], [place] [address] [phone]

## 2023-11-20 ENCOUNTER — Other Ambulatory Visit: Payer: Self-pay | Admitting: Orthopaedic Surgery

## 2023-11-22 ENCOUNTER — Other Ambulatory Visit: Payer: Self-pay

## 2023-11-22 DIAGNOSIS — M5412 Radiculopathy, cervical region: Secondary | ICD-10-CM

## 2023-11-22 NOTE — Telephone Encounter (Signed)
 If still needing this for neck I would refer to megan

## 2023-11-22 NOTE — Telephone Encounter (Signed)
 Patient notified and referral for Duwaine Pouch has been ordered.

## 2023-12-07 ENCOUNTER — Ambulatory Visit: Admitting: Physical Medicine and Rehabilitation

## 2023-12-27 ENCOUNTER — Encounter: Payer: Self-pay | Admitting: Radiology
